# Patient Record
Sex: Male | Born: 1975 | Race: White | Hispanic: No | Smoking: Current every day smoker
Health system: Southern US, Community
[De-identification: ages and names within clinical notes are randomized; demographics above are authoritative.]

## PROBLEM LIST (undated history)

## (undated) DIAGNOSIS — R569 Unspecified convulsions: Secondary | ICD-10-CM

## (undated) DIAGNOSIS — I10 Essential (primary) hypertension: Secondary | ICD-10-CM

## (undated) DIAGNOSIS — M329 Systemic lupus erythematosus, unspecified: Secondary | ICD-10-CM

## (undated) DIAGNOSIS — IMO0002 Reserved for concepts with insufficient information to code with codable children: Secondary | ICD-10-CM

## (undated) DIAGNOSIS — F319 Bipolar disorder, unspecified: Secondary | ICD-10-CM

## (undated) DIAGNOSIS — F329 Major depressive disorder, single episode, unspecified: Secondary | ICD-10-CM

## (undated) DIAGNOSIS — M069 Rheumatoid arthritis, unspecified: Secondary | ICD-10-CM

## (undated) DIAGNOSIS — F32A Depression, unspecified: Secondary | ICD-10-CM

---

## 2000-07-12 ENCOUNTER — Emergency Department (HOSPITAL_COMMUNITY): Admission: EM | Admit: 2000-07-12 | Discharge: 2000-07-12 | Payer: Self-pay | Admitting: *Deleted

## 2001-12-15 ENCOUNTER — Emergency Department (HOSPITAL_COMMUNITY): Admission: EM | Admit: 2001-12-15 | Discharge: 2001-12-15 | Payer: Self-pay | Admitting: Emergency Medicine

## 2001-12-29 ENCOUNTER — Emergency Department (HOSPITAL_COMMUNITY): Admission: EM | Admit: 2001-12-29 | Discharge: 2001-12-29 | Payer: Self-pay | Admitting: Emergency Medicine

## 2003-04-30 ENCOUNTER — Emergency Department (HOSPITAL_COMMUNITY): Admission: EM | Admit: 2003-04-30 | Discharge: 2003-04-30 | Payer: Self-pay | Admitting: Emergency Medicine

## 2003-07-08 ENCOUNTER — Emergency Department (HOSPITAL_COMMUNITY): Admission: EM | Admit: 2003-07-08 | Discharge: 2003-07-08 | Payer: Self-pay | Admitting: Family Medicine

## 2003-11-04 ENCOUNTER — Emergency Department (HOSPITAL_COMMUNITY): Admission: EM | Admit: 2003-11-04 | Discharge: 2003-11-04 | Payer: Self-pay | Admitting: Emergency Medicine

## 2004-02-16 ENCOUNTER — Emergency Department (HOSPITAL_COMMUNITY): Admission: EM | Admit: 2004-02-16 | Discharge: 2004-02-16 | Payer: Self-pay | Admitting: Emergency Medicine

## 2004-05-14 ENCOUNTER — Emergency Department (HOSPITAL_COMMUNITY): Admission: EM | Admit: 2004-05-14 | Discharge: 2004-05-14 | Payer: Self-pay | Admitting: Emergency Medicine

## 2004-06-25 ENCOUNTER — Emergency Department (HOSPITAL_COMMUNITY): Admission: EM | Admit: 2004-06-25 | Discharge: 2004-06-25 | Payer: Self-pay | Admitting: Emergency Medicine

## 2015-09-28 ENCOUNTER — Encounter (HOSPITAL_COMMUNITY): Payer: Self-pay

## 2015-09-28 ENCOUNTER — Emergency Department (HOSPITAL_COMMUNITY)
Admission: EM | Admit: 2015-09-28 | Discharge: 2015-09-28 | Disposition: A | Discharge status: Home / Self Care | Payer: Self-pay | Attending: Emergency Medicine | Admitting: Emergency Medicine

## 2015-09-28 DIAGNOSIS — I10 Essential (primary) hypertension: Secondary | ICD-10-CM | POA: Insufficient documentation

## 2015-09-28 DIAGNOSIS — F1510 Other stimulant abuse, uncomplicated: Secondary | ICD-10-CM | POA: Insufficient documentation

## 2015-09-28 DIAGNOSIS — F17200 Nicotine dependence, unspecified, uncomplicated: Secondary | ICD-10-CM | POA: Insufficient documentation

## 2015-09-28 DIAGNOSIS — Z79899 Other long term (current) drug therapy: Secondary | ICD-10-CM | POA: Insufficient documentation

## 2015-09-28 HISTORY — DX: Essential (primary) hypertension: I10

## 2015-09-28 HISTORY — DX: Reserved for concepts with insufficient information to code with codable children: IMO0002

## 2015-09-28 HISTORY — DX: Systemic lupus erythematosus, unspecified: M32.9

## 2015-09-28 LAB — COMPREHENSIVE METABOLIC PANEL
ALT: 17 U/L (ref 17–63)
AST: 20 U/L (ref 15–41)
Albumin: 4.5 g/dL (ref 3.5–5.0)
Alkaline Phosphatase: 88 U/L (ref 38–126)
Anion gap: 7 (ref 5–15)
BUN: 16 mg/dL (ref 6–20)
CALCIUM: 9.5 mg/dL (ref 8.9–10.3)
CO2: 24 mmol/L (ref 22–32)
CREATININE: 0.93 mg/dL (ref 0.61–1.24)
Chloride: 110 mmol/L (ref 101–111)
GFR calc non Af Amer: >60 mL/min (ref >60)
Glucose, Bld: 89 mg/dL (ref 65–99)
Potassium: 4.1 mmol/L (ref 3.5–5.1)
SODIUM: 141 mmol/L (ref 135–145)
TOTAL PROTEIN: 8.7 g/dL — AB (ref 6.5–8.1)
Total Bilirubin: 0.4 mg/dL (ref 0.3–1.2)

## 2015-09-28 LAB — CBC
HCT: 44.2 % (ref 39.0–52.0)
Hemoglobin: 14.3 g/dL (ref 13.0–17.0)
MCH: 29.3 pg (ref 26.0–34.0)
MCHC: 32.4 g/dL (ref 30.0–36.0)
MCV: 90.6 fL (ref 78.0–100.0)
PLATELETS: 276 10*3/uL (ref 150–400)
RBC: 4.88 MIL/uL (ref 4.22–5.81)
RDW: 14.4 % (ref 11.5–15.5)
WBC: 9.9 10*3/uL (ref 4.0–10.5)

## 2015-09-28 LAB — ACETAMINOPHEN LEVEL: Acetaminophen (Tylenol), Serum: <10 ug/mL — ABNORMAL LOW (ref 10–30)

## 2015-09-28 LAB — SALICYLATE LEVEL

## 2015-09-28 LAB — ETHANOL

## 2015-09-28 MED ORDER — LORAZEPAM 1 MG PO TABS: 1.0000 mg | ORAL_TABLET | Freq: Three times a day (TID) | ORAL | 0 refills | Status: DC | PRN

## 2015-09-28 MED ORDER — LORAZEPAM 1 MG PO TABS
2.0000 mg | ORAL_TABLET | Freq: Once | ORAL | Status: AC
Start: 2015-09-28 — End: 2015-09-28
  Administered 2015-09-28: 2 mg via ORAL
  Filled 2015-09-28: qty 2

## 2015-09-28 NOTE — BH Assessment (Signed)
Spoke with patient about outpatient resources and provided patient with list of resources.  Provided a list of outpatient resources and he states he prefers a long term program. Discussed [facility] with patient and he states that he will go to [facility] to seek help.  Patient states that he will go to [facility] tomorrow but he plans to call around to other facilities to look at his options. He states that he will go into a program by the end of the week.   Davina Poke, LCSW Therapeutic Triage Specialist [facility] 09/28/2015 11:08 PM

## 2015-09-28 NOTE — BH Assessment (Addendum)
Spoke with patients mother regarding the IVC. She states that she knows that her son sent the messages but she does not have them available at this time. Patients mother states that the messages said "somthing like I might as well die." She states that to her knowledge her son has never attempted to hurt himself or anyone else but she wants him to get treatment for his drug use. Patients mother asked about the assessment and if patient would be admitted and was informed that the psych on call would be contacted and a decision would be made, however, that information could not be disclosed due to confidentiality - but that she is able to communicate with her son on the final disposition if she would like.   Davina Poke, LCSW Therapeutic Triage Specialist [facility] 09/28/2015 10:57 PM

## 2015-09-28 NOTE — Discharge Instructions (Signed)
Follow-up for treatment at a facility such as [facility].  Avoid use of methamphetamine.  Other available resources:  Residential Treatment Programs ASAP Residential Treatment    [facility] [address]     [address] [place], [place] [phone]      [phone] or [phone]  [facility]     The [address] (Several in [place]) [address] [place], [place] [address]    [address] [place] [address]     [place], [place] [phone]      [phone]  [facility]   [facility] [address]     [address] [place], [place] [address]                 [place], [place] [phone]                  [phone] Admissions: 8am-3pm M-F

## 2015-09-28 NOTE — ED Provider Notes (Signed)
[facility]-EMERGENCY DEPT Provider Note   CSN: [id number] Arrival date & time: 09/28/15  2018     History   Chief Complaint Chief Complaint  Patient presents with  . Suicidal    HPI Kenneth Elliott is a 40 y.o. male.  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 He states that he injects methamphetamines, daily, "to get high". He denies use of other illicit drugs. Today he was feeling like he needed to "get help" so he called his parents. He walked to their home, and just as he got there, a Sheriff's officers showed up and then brought him here. The officer states that family nurse told him that the patient was suicidal, and that they would go get papers, for commitment. As of 21:00 hours, no IVC paperwork was here. The patient denies suicidal ideation or plan at this time. He states he recently came to, [place] to get help, from [place] where he was living for 11 years. He denies other recent illnesses. There are no other known modifying factors.  HPI  Past Medical History:  Diagnosis Date  . Hypertension   . Lupus (HCC)     There are no active problems to display for this patient.   History reviewed. No pertinent surgical history.     Home Medications    Prior to Admission medications   Medication Sig Start Date End Date Taking? Authorizing Provider  loratadine (CLARITIN) 10 MG tablet Take 10 mg by mouth daily.   Yes Historical Provider, MD  LORazepam (ATIVAN) 1 MG tablet Take 1 tablet (1 mg total) by mouth 3 (three) times daily as needed for anxiety. 09/28/15   Mancel Bale, MD    Family History History reviewed. No pertinent family history.  Social History Social History  Substance Use Topics  . Smoking status: Current Every Day Smoker  . Smokeless tobacco: Current User  . Alcohol use Yes     Allergies   Review of patient's allergies indicates no known allergies.   Review of Systems Review of Systems  All other systems reviewed and  are negative.    Physical Exam Updated Vital Signs BP 140/84 (BP Location: Left Arm)   Pulse 108   Temp 98.7 F (37.1 C) (Oral)   Resp 20   SpO2 100%   Physical Exam  Constitutional: He is oriented to person, place, and time. He appears well-developed and well-nourished.  HENT:  Head: Normocephalic and atraumatic.  Right Ear: External ear normal.  Left Ear: External ear normal.  Eyes: Conjunctivae and EOM are normal. Pupils are equal, round, and reactive to light.  Neck: Normal range of motion and phonation normal. Neck supple.  Cardiovascular:  Tachycardia  Pulmonary/Chest: Effort normal. He exhibits no bony tenderness.  Musculoskeletal: Normal range of motion.  Neurological: He is alert and oriented to person, place, and time. No cranial nerve deficit or sensory deficit. He exhibits normal muscle tone. Coordination normal.  Skin: Skin is warm, dry and intact.  Psychiatric: He has a normal mood and affect. His behavior is normal. Judgment and thought content normal.  No acute psychosis. He does not appear depressed.  Nursing note and vitals reviewed.    ED Treatments / Results  Labs (all labs ordered are listed, but only abnormal results are displayed) Labs Reviewed  COMPREHENSIVE METABOLIC PANEL - Abnormal; Notable for the following:       Result Value   Total Protein 8.7 (*)    All other components within normal limits  ACETAMINOPHEN LEVEL - Abnormal; Notable for the following:    Acetaminophen (Tylenol), Serum <10 (*)    All other components within normal limits  ETHANOL  SALICYLATE LEVEL  CBC  URINE RAPID DRUG SCREEN, HOSP PERFORMED    EKG  EKG Interpretation None       Radiology No results found.  Procedures Procedures (including critical care time)  Medications Ordered in ED Medications  LORazepam (ATIVAN) tablet 2 mg (2 mg Oral Given 09/28/15 2148)     Initial Impression / Assessment and Plan / ED Course  I have reviewed the triage vital  signs and the nursing notes.  Pertinent labs & imaging results that were available during my care of the patient were reviewed by me and considered in my medical decision making (see chart for details).  Clinical Course    Medications  LORazepam (ATIVAN) tablet 2 mg (2 mg Oral Given 09/28/15 2148)    Patient Vitals for the past 24 hrs:  BP Temp Temp src Pulse Resp SpO2  09/28/15 2040 140/84 98.7 F (37.1 C) Oral 108 20 100 %   TTS consult- they gave him referrals for outpatient treatment for substance abuse.  22:00 PM Reevaluation with update and discussion. After initial assessment and treatment, an updated evaluation reveals he is alert and comfortable. He continues to deny suicidal ideation. Patient's mother petitioned for commitment secondary to concern for suicidality. Patient continues to deny suicidality. He is alert and appropriate. IVC petition was rescinded, by me. Shanessa Hodak L    Final Clinical Impressions(s) / ED Diagnoses   Final diagnoses:  Methamphetamine abuse    Substance abuse, not requiring detoxification. No suicidal or homicidal ideation. He is stable for discharge with outpatient management. We'll give a short term prescription of Ativan to help with avoidance of craving for methamphetamine.  Nursing Notes Reviewed/ Care Coordinated Applicable Imaging Reviewed Interpretation of Laboratory Data incorporated into ED treatment  The patient appears reasonably screened and/or stabilized for discharge and I doubt any other medical condition or other [facility] requiring further screening, evaluation, or treatment in the ED at this time prior to discharge.  Plan: Home Medications- continue; Home Treatments- avoid meth; return here if the recommended treatment, does not improve the symptoms; Recommended follow up- PCP prn, Treatment for substance abuse, as an outpatient   New Prescriptions New Prescriptions   LORAZEPAM (ATIVAN) 1 MG TABLET    Take 1 tablet (1 mg  total) by mouth 3 (three) times daily as needed for anxiety.     Mancel Bale, MD 09/28/15 [phone]

## 2015-09-28 NOTE — ED Notes (Signed)
[employer] here with IVC papers, TTS and Dr Effie Shy aware

## 2015-09-28 NOTE — ED Triage Notes (Signed)
Pt states that he's not suicidal or homicidal, he states that he's very addicted to meth, that's his drug of choice. Pt hasn't done any drugs since Monday, he states that he wants to get off the drugs and get clean and get his life together.

## 2015-09-28 NOTE — BH Assessment (Addendum)
Assessment Note  Kenneth Elliott is an 40 y.o. male presenting under IVC by his mother Kenneth Elliott [phone]..   IVC states   Danger to self and others. Respondent is on [employer] and has been sending messages via text and phone calls that suicide is his only option. Respondent is also hallucinating saying he hears people talking to him. He goes through manic episodes calling people over and over. Respondent reportedly uses crystal meth everyday.   Patient states that he did send the text messages earlier because he was upset with his mother. Patient states that he moved from [place] on Monday with the hopes of getting a new ID and SS card and go [facility]. Patient states that his mother told him that he had to stop smoking and that he would not get a nicotine patch and he became upset and he felt that he was tricked into coming to [place]. Patient states that he sent the text messages out of anger but state that he did not mean to say it and he apologized. Patient states that he has difficulty with "outbursts" and states that he would like to learn better coping skills. Patient states "everybody says stuff they don't mean sometimes." Patient states that he feels that his mother wants him to go to treatment "so it won't be her problem and they can fix me." Patient states that he has tried to explain that he is willing to go to treatment but would like to go to a facility that allows smoking or nicotine patches.    Patient denies SI and history of attempts. Patient denies self injurious behaviors. Patient denies HI and history of aggression. Patient denies AVH and does not appear to be responding to internal stimuli during the assessment. Patient states that he uses about .5 grams of Methamphetamines about two to three times per week. Patient states that he last used Meth on Monday on his way to [place] from [place]. Patient denies use of other drugs  or alcohol. Patient UDS not collected and BAL <5 at time of assessment.  Patient is calm and cooperative and appears to have insight. Patient denies SI/HI and AVH and contracts for safety at this time.   Consulted with Donell Sievert, PA-C who recommends patient be discharged to follow up with outpatient resources.   Diagnosis: Amphetamine-type substance use disorder  Past Medical History:  Past Medical History:  Diagnosis Date  . Hypertension   . Lupus (HCC)     History reviewed. No pertinent surgical history.  Family History: History reviewed. No pertinent family history.  Social History:  reports that he has been smoking.  He uses smokeless tobacco. He reports that he drinks alcohol. His drug history is not on file.  Additional Social History:  Alcohol / Drug Use Pain Medications: Denies Prescriptions: Denies Over the Counter: Denies History of alcohol / drug use?: Yes Longest period of sobriety (when/how long): 2 months Substance #1 Name of Substance 1: Methamphetamines 1 - Age of First Use: 38 1 - Amount (size/oz): .5 gram 1 - Frequency: "couple times a week" 1 - Duration: ongoing 1 - Last Use / Amount: Monday morning  CIWA: CIWA-Ar BP: 140/84 Pulse Rate: 108 COWS:    Allergies: No Known Allergies  Home Medications:  (Not in a hospital admission)  OB/GYN Status:  No LMP for male patient.  General Assessment Data [facility] of [facility]: [facility] TTS Assessment: In system Is this a Tele  or Face-to-Face Assessment?: Face-to-Face Is this an Initial Assessment or a Re-assessment for this encounter?: Initial Assessment Marital status: Divorced Is patient pregnant?: No Pregnancy Status: No Living Arrangements:  (just moved here Monday) Admission Status: Involuntary Is patient capable of signing voluntary admission?: No Referral Source: Other (IVC)     Crisis Care Plan Living Arrangements:  (just moved here Monday) Name of Psychiatrist: None Name of Therapist:  None  Education Status Is patient currently in school?: No Highest grade of school patient has completed: 12th  Risk to self with the past 6 months Suicidal Ideation: No Has patient been a risk to self within the past 6 months prior to admission? : No Suicidal Intent: No Has patient had any suicidal intent within the past 6 months prior to admission? : No Is patient at risk for suicide?: No Suicidal Plan?: No Has patient had any suicidal plan within the past 6 months prior to admission? : No Access to Means: No What has been your use of drugs/alcohol within the last 12 months?: Meth 3x/week Previous Attempts/Gestures: No How many times?: 0 Other Self Harm Risks: Denies Triggers for Past Attempts: None known Intentional Self Injurious Behavior: None Family Suicide History: No Recent stressful life event(s):  (conflict with mother) Persecutory voices/beliefs?: No Depression: No Depression Symptoms:  (denies symptoms) Substance abuse history and/or treatment for substance abuse?: Yes Suicide prevention information given to non-admitted patients: Not applicable  Risk to Others within the past 6 months Homicidal Ideation: No Does patient have any lifetime risk of violence toward others beyond the six months prior to admission? : No Thoughts of Harm to Others: No Current Homicidal Intent: No Current Homicidal Plan: No Access to Homicidal Means: No Identified Victim: Denies History of harm to others?: No Assessment of Violence: None Noted Violent Behavior Description: Denies Does patient have access to weapons?: No Criminal Charges Pending?: No Does patient have a court date: No Is patient on probation?: No  Psychosis Hallucinations: None noted Delusions: None noted  Mental Status Report Appearance/Hygiene: Unremarkable Eye Contact: Good Motor Activity: Unremarkable Speech: Logical/coherent Level of Consciousness: Alert Mood: Pleasant Affect: Appropriate to  circumstance Anxiety Level: None Thought Processes: Coherent, Relevant Judgement: Partial Orientation: Person, Place, Time, Situation, Appropriate for developmental age Obsessive Compulsive Thoughts/Behaviors: None  Cognitive Functioning Concentration: Normal Memory: Recent Intact, Remote Intact IQ: Average Insight: Good Impulse Control: Good Appetite: Good Sleep: No Change Total Hours of Sleep:  (7-8) Vegetative Symptoms: None  ADLScreening [facility] Assessment Services) Patient's cognitive ability adequate to safely complete daily activities?: Yes Patient able to express need for assistance with ADLs?: Yes Independently performs ADLs?: Yes (appropriate for developmental age)  Prior Inpatient Therapy Prior Inpatient Therapy: No Prior Therapy Dates: N/A Prior Therapy Facilty/Provider(s): N/A Reason for Treatment: N/A  Prior Outpatient Therapy Prior Outpatient Therapy: No Prior Therapy Dates: N/A Prior Therapy Facilty/Provider(s): N/A Reason for Treatment: N/A Does patient have an ACCT team?: No Does patient have Intensive In-House Services?  : No Does patient have [facility] services? : No Does patient have P4CC services?: No  ADL Screening (condition at time of admission) Patient's cognitive ability adequate to safely complete daily activities?: Yes Is the patient deaf or have difficulty hearing?: No Does the patient have difficulty seeing, even when wearing glasses/contacts?: No Does the patient have difficulty concentrating, remembering, or making decisions?: No Patient able to express need for assistance with ADLs?: Yes Does the patient have difficulty dressing or bathing?: No Independently performs ADLs?: Yes (appropriate for developmental age) Does  the patient have difficulty walking or climbing stairs?: No Weakness of Legs: None Weakness of Arms/Hands: None  Home Assistive Devices/Equipment Home Assistive Devices/Equipment: None    Abuse/Neglect Assessment  (Assessment to be complete while patient is alone) Physical Abuse: Denies Verbal Abuse: Denies Sexual Abuse: Denies Exploitation of patient/patient's resources: Denies Self-Neglect: Denies Values / Beliefs Cultural Requests During Hospitalization: None Spiritual Requests During Hospitalization: None   Advance Directives (For Healthcare) Does patient have an advance directive?: No Would patient like information on creating an advanced directive?: No - patient declined information    Additional Information 1:1 In Past 12 Months?: No CIRT Risk: No Elopement Risk: No Does patient have medical clearance?: No     Disposition:  Disposition Initial Assessment Completed for this Encounter: Yes Disposition of Patient: Outpatient treatment, Referred to (per Donell Sievert, PA-C) Type of outpatient treatment: Adult, [facility] - Intensive Outpatient Patient referred to: [facility], [facility], [facility]  On Site Evaluation by:   Reviewed with Physician:    Lucciana Head 09/28/2015 11:30 PM

## 2015-09-28 NOTE — BH Assessment (Addendum)
Assessment completed. Consulted with Donell Sievert, PA-C who states that patient does not meet inpatient criteria and can be discharged with outpatient resources. Dr. Effie Shy, EDP, is in agreement and states that he will rescind the IVC.    Davina Poke, LCSW Therapeutic Triage Specialist [facility] 09/28/2015 10:54 PM

## 2015-09-28 NOTE — ED Notes (Signed)
Patient was alert, oriented and stable upon discharge. RN went over AVS and patient had no further questions.  

## 2015-10-04 ENCOUNTER — Emergency Department (HOSPITAL_COMMUNITY)
Admission: EM | Admit: 2015-10-04 | Discharge: 2015-10-05 | Disposition: A | Discharge status: Other Acute Inpatient Hospital | Payer: Self-pay

## 2015-10-04 ENCOUNTER — Encounter (HOSPITAL_COMMUNITY): Payer: Self-pay | Admitting: *Deleted

## 2015-10-04 DIAGNOSIS — I10 Essential (primary) hypertension: Secondary | ICD-10-CM | POA: Insufficient documentation

## 2015-10-04 DIAGNOSIS — R45851 Suicidal ideations: Secondary | ICD-10-CM | POA: Insufficient documentation

## 2015-10-04 DIAGNOSIS — F1910 Other psychoactive substance abuse, uncomplicated: Secondary | ICD-10-CM | POA: Insufficient documentation

## 2015-10-04 DIAGNOSIS — Z79899 Other long term (current) drug therapy: Secondary | ICD-10-CM | POA: Insufficient documentation

## 2015-10-04 DIAGNOSIS — F17200 Nicotine dependence, unspecified, uncomplicated: Secondary | ICD-10-CM | POA: Insufficient documentation

## 2015-10-04 LAB — COMPREHENSIVE METABOLIC PANEL
ALBUMIN: 3.8 g/dL (ref 3.5–5.0)
ALK PHOS: 76 U/L (ref 38–126)
ALT: 15 U/L — AB (ref 17–63)
ANION GAP: 8 (ref 5–15)
AST: 19 U/L (ref 15–41)
BILIRUBIN TOTAL: 0.3 mg/dL (ref 0.3–1.2)
BUN: <5 mg/dL — ABNORMAL LOW (ref 6–20)
CALCIUM: 9.2 mg/dL (ref 8.9–10.3)
CO2: 29 mmol/L (ref 22–32)
CREATININE: 0.87 mg/dL (ref 0.61–1.24)
Chloride: 103 mmol/L (ref 101–111)
GFR calc Af Amer: >60 mL/min (ref >60)
GFR calc non Af Amer: >60 mL/min (ref >60)
GLUCOSE: 76 mg/dL (ref 65–99)
Potassium: 3.2 mmol/L — ABNORMAL LOW (ref 3.5–5.1)
Sodium: 140 mmol/L (ref 135–145)
TOTAL PROTEIN: 7.2 g/dL (ref 6.5–8.1)

## 2015-10-04 LAB — CBC
HEMATOCRIT: 42.7 % (ref 39.0–52.0)
Hemoglobin: 13.7 g/dL (ref 13.0–17.0)
MCH: 29 pg (ref 26.0–34.0)
MCHC: 32.1 g/dL (ref 30.0–36.0)
MCV: 90.5 fL (ref 78.0–100.0)
Platelets: 286 10*3/uL (ref 150–400)
RBC: 4.72 MIL/uL (ref 4.22–5.81)
RDW: 13.8 % (ref 11.5–15.5)
WBC: 11.8 10*3/uL — ABNORMAL HIGH (ref 4.0–10.5)

## 2015-10-04 LAB — RAPID URINE DRUG SCREEN, HOSP PERFORMED
Amphetamines: NOT DETECTED
BARBITURATES: NOT DETECTED
Benzodiazepines: NOT DETECTED
COCAINE: NOT DETECTED
Opiates: NOT DETECTED
TETRAHYDROCANNABINOL: NOT DETECTED

## 2015-10-04 LAB — ACETAMINOPHEN LEVEL

## 2015-10-04 LAB — SALICYLATE LEVEL: Salicylate Lvl: <4 mg/dL (ref 2.8–30.0)

## 2015-10-04 LAB — ETHANOL: Alcohol, Ethyl (B): <5 mg/dL (ref <5)

## 2015-10-04 MED ORDER — LORAZEPAM 1 MG PO TABS
1.0000 mg | ORAL_TABLET | Freq: Once | ORAL | Status: AC
  Administered 2015-10-04: 1 mg via ORAL
  Filled 2015-10-04: qty 1

## 2015-10-04 MED ORDER — NICOTINE 21 MG/24HR TD PT24
21.0000 mg | MEDICATED_PATCH | Freq: Once | TRANSDERMAL | Status: DC
  Administered 2015-10-04: 21 mg via TRANSDERMAL
  Filled 2015-10-04: qty 1

## 2015-10-04 NOTE — ED Triage Notes (Addendum)
Pt wants detox from heroin, meth, cocaine, benzodiazapine's. Last use Saturday shooting heroin. Pt denies ETOH use, denies HI, states he denies SI but does have SI thoughts sometime. Pt states he was seen at [facility] but was not ready to commit to detox.

## 2015-10-04 NOTE — ED Notes (Signed)
Pt provided with pod C information. Pt up to phone. On call with mother.

## 2015-10-04 NOTE — ED Provider Notes (Signed)
[facility]-EMERGENCY DEPT Provider Note   CSN: [id number] Arrival date & time: 10/04/15  4098     History   Chief Complaint Chief Complaint  Patient presents with  . Drug Problem  . Suicidal    HPI Kenneth Elliott is a 40 y.o. male.  Patient is a 40 year old male who requests detox. He states he has a long-standing history of methamphetamine use. He also recently has been using heroin and cocaine at times. He currently denies any other drug use. He denies any alcohol use. He is starting to feel anxious and panicky. He hasn't used in 2 days. He also is feeling very depressed. He states he's having thoughts of wanting to kill himself. He states at times he feels like he might overdose on drugs. Other than feeling anxious, he denies any physical complaints. He does have a history of hypertension and previously was taking clonidine and metoprolol but hasn't had these medications for the last couple of weeks.    Drug Problem  Pertinent negatives include no chest pain, no abdominal pain, no headaches and no shortness of breath.    Past Medical History:  Diagnosis Date  . Hypertension   . Lupus (HCC)     There are no active problems to display for this patient.   History reviewed. No pertinent surgical history.     Home Medications    Prior to Admission medications   Medication Sig Start Date End Date Taking? Authorizing Provider  LORazepam (ATIVAN) 1 MG tablet Take 1 tablet (1 mg total) by mouth 3 (three) times daily as needed for anxiety. 09/28/15  Yes Mancel Bale, MD    Family History History reviewed. No pertinent family history.  Social History Social History  Substance Use Topics  . Smoking status: Current Every Day Smoker  . Smokeless tobacco: Current User  . Alcohol use Yes     Allergies   Review of patient's allergies indicates no known allergies.   Review of Systems Review of Systems  Constitutional: Negative for chills, diaphoresis, fatigue and fever.   HENT: Negative for congestion, rhinorrhea and sneezing.   Eyes: Negative.   Respiratory: Negative for cough, chest tightness and shortness of breath.   Cardiovascular: Negative for chest pain and leg swelling.  Gastrointestinal: Negative for abdominal pain, blood in stool, diarrhea, nausea and vomiting.  Genitourinary: Negative for difficulty urinating, flank pain, frequency and hematuria.  Musculoskeletal: Negative for arthralgias and back pain.  Skin: Negative for rash.  Neurological: Negative for dizziness, speech difficulty, weakness, numbness and headaches.  Psychiatric/Behavioral: Positive for agitation, sleep disturbance and suicidal ideas. The patient is nervous/anxious.      Physical Exam Updated Vital Signs BP 154/92   Pulse 101   Temp 98.7 F (37.1 C) (Oral)   Resp 20   Ht 5\' 9"  (1.753 m)   Wt 160 lb 4.8 oz (72.7 kg)   SpO2 100%   BMI 23.67 kg/m   Physical Exam  Constitutional: He is oriented to person, place, and time. He appears well-developed and well-nourished.  HENT:  Head: Normocephalic and atraumatic.  Eyes: Pupils are equal, round, and reactive to light.  Neck: Normal range of motion. Neck supple.  Cardiovascular: Normal rate, regular rhythm and normal heart sounds.   Pulmonary/Chest: Effort normal and breath sounds normal. No respiratory distress. He has no wheezes. He has no rales. He exhibits no tenderness.  Abdominal: Soft. Bowel sounds are normal. There is no tenderness. There is no rebound and no guarding.  Musculoskeletal: Normal  range of motion. He exhibits no edema.  Lymphadenopathy:    He has no cervical adenopathy.  Neurological: He is alert and oriented to person, place, and time.  Skin: Skin is warm and dry. No rash noted.  Psychiatric: He has a normal mood and affect.     ED Treatments / Results  Labs (all labs ordered are listed, but only abnormal results are displayed) Labs Reviewed  COMPREHENSIVE METABOLIC PANEL - Abnormal; Notable  for the following:       Result Value   Potassium 3.2 (*)    BUN <5 (*)    ALT 15 (*)    All other components within normal limits  ACETAMINOPHEN LEVEL - Abnormal; Notable for the following:    Acetaminophen (Tylenol), Serum <10 (*)    All other components within normal limits  CBC - Abnormal; Notable for the following:    WBC 11.8 (*)    All other components within normal limits  ETHANOL  SALICYLATE LEVEL  URINE RAPID DRUG SCREEN, HOSP PERFORMED    EKG  EKG Interpretation None       Radiology No results found.  Procedures Procedures (including critical care time)  Medications Ordered in ED Medications  nicotine (NICODERM CQ - dosed in mg/24 hours) patch 21 mg (21 mg Transdermal Patch Applied 10/04/15 2009)  LORazepam (ATIVAN) tablet 1 mg (1 mg Oral Given 10/04/15 2139)     Initial Impression / Assessment and Plan / ED Course  I have reviewed the triage vital signs and the nursing notes.  Pertinent labs & imaging results that were available during my care of the patient were reviewed by me and considered in my medical decision making (see chart for details).  Clinical Course    Patient has been medically cleared and is currently awaiting TTS consult.  Final Clinical Impressions(s) / ED Diagnoses   Final diagnoses:  Polysubstance abuse  Suicidal thoughts    New Prescriptions New Prescriptions   No medications on file     Rolan Bucco, MD 10/05/15 [phone]

## 2015-10-04 NOTE — ED Notes (Signed)
EDP at bedside  

## 2015-10-04 NOTE — ED Notes (Signed)
Pt complains of feeling anxious. MD aware.

## 2015-10-05 ENCOUNTER — Inpatient Hospital Stay (HOSPITAL_COMMUNITY)
Admission: EM | Admit: 2015-10-05 | Discharge: 2015-10-10 | DRG: 885 | Disposition: A | Discharge status: Home / Self Care | Payer: Federal, State, Local not specified - Other | Source: Intra-hospital | Attending: Psychiatry | Admitting: Psychiatry

## 2015-10-05 ENCOUNTER — Encounter (HOSPITAL_COMMUNITY): Payer: Self-pay

## 2015-10-05 DIAGNOSIS — F1123 Opioid dependence with withdrawal: Secondary | ICD-10-CM | POA: Diagnosis present

## 2015-10-05 DIAGNOSIS — F1523 Other stimulant dependence with withdrawal: Secondary | ICD-10-CM | POA: Diagnosis present

## 2015-10-05 DIAGNOSIS — R45851 Suicidal ideations: Secondary | ICD-10-CM | POA: Diagnosis not present

## 2015-10-05 DIAGNOSIS — F332 Major depressive disorder, recurrent severe without psychotic features: Secondary | ICD-10-CM | POA: Diagnosis present

## 2015-10-05 DIAGNOSIS — M069 Rheumatoid arthritis, unspecified: Secondary | ICD-10-CM | POA: Diagnosis present

## 2015-10-05 DIAGNOSIS — M329 Systemic lupus erythematosus, unspecified: Secondary | ICD-10-CM | POA: Diagnosis present

## 2015-10-05 DIAGNOSIS — F17210 Nicotine dependence, cigarettes, uncomplicated: Secondary | ICD-10-CM | POA: Diagnosis present

## 2015-10-05 DIAGNOSIS — F1920 Other psychoactive substance dependence, uncomplicated: Secondary | ICD-10-CM | POA: Diagnosis not present

## 2015-10-05 DIAGNOSIS — Z818 Family history of other mental and behavioral disorders: Secondary | ICD-10-CM

## 2015-10-05 DIAGNOSIS — Z79899 Other long term (current) drug therapy: Secondary | ICD-10-CM

## 2015-10-05 DIAGNOSIS — F419 Anxiety disorder, unspecified: Secondary | ICD-10-CM | POA: Diagnosis present

## 2015-10-05 DIAGNOSIS — F329 Major depressive disorder, single episode, unspecified: Secondary | ICD-10-CM | POA: Diagnosis present

## 2015-10-05 DIAGNOSIS — I10 Essential (primary) hypertension: Secondary | ICD-10-CM | POA: Diagnosis present

## 2015-10-05 DIAGNOSIS — G4700 Insomnia, unspecified: Secondary | ICD-10-CM | POA: Diagnosis present

## 2015-10-05 DIAGNOSIS — K219 Gastro-esophageal reflux disease without esophagitis: Secondary | ICD-10-CM | POA: Diagnosis present

## 2015-10-05 HISTORY — DX: Unspecified convulsions: R56.9

## 2015-10-05 HISTORY — DX: Rheumatoid arthritis, unspecified: M06.9

## 2015-10-05 MED ORDER — METHOCARBAMOL 500 MG PO TABS: 500.0000 mg | ORAL_TABLET | Freq: Three times a day (TID) | ORAL | Status: AC | PRN

## 2015-10-05 MED ORDER — CLONIDINE HCL 0.1 MG PO TABS
0.1000 mg | ORAL_TABLET | Freq: Four times a day (QID) | ORAL | Status: AC
  Administered 2015-10-05 – 2015-10-06 (×7): 0.1 mg via ORAL
  Filled 2015-10-05 (×8): qty 1

## 2015-10-05 MED ORDER — MAGNESIUM HYDROXIDE 400 MG/5ML PO SUSP: 30.0000 mL | Freq: Every day | ORAL | Status: DC | PRN

## 2015-10-05 MED ORDER — CLONIDINE HCL 0.1 MG PO TABS
0.1000 mg | ORAL_TABLET | Freq: Two times a day (BID) | ORAL | Status: DC | PRN
  Administered 2015-10-05: 0.1 mg via ORAL
  Filled 2015-10-05: qty 1

## 2015-10-05 MED ORDER — CLONIDINE HCL 0.1 MG PO TABS
0.1000 mg | ORAL_TABLET | Freq: Every day | ORAL | Status: AC
  Administered 2015-10-09 – 2015-10-10 (×2): 0.1 mg via ORAL
  Filled 2015-10-05 (×2): qty 1

## 2015-10-05 MED ORDER — NICOTINE 21 MG/24HR TD PT24
21.0000 mg | MEDICATED_PATCH | Freq: Every day | TRANSDERMAL | Status: DC
  Administered 2015-10-05 – 2015-10-10 (×6): 21 mg via TRANSDERMAL
  Filled 2015-10-05 (×8): qty 1

## 2015-10-05 MED ORDER — HYDROXYZINE HCL 25 MG PO TABS
25.0000 mg | ORAL_TABLET | Freq: Four times a day (QID) | ORAL | Status: AC | PRN
  Administered 2015-10-05 – 2015-10-09 (×8): 25 mg via ORAL
  Filled 2015-10-05 (×4): qty 1
  Filled 2015-10-05: qty 10
  Filled 2015-10-05 (×3): qty 1

## 2015-10-05 MED ORDER — LOPERAMIDE HCL 2 MG PO CAPS: 2.0000 mg | ORAL_CAPSULE | ORAL | Status: AC | PRN

## 2015-10-05 MED ORDER — SERTRALINE HCL 25 MG PO TABS
25.0000 mg | ORAL_TABLET | Freq: Every day | ORAL | Status: DC
  Administered 2015-10-05 – 2015-10-06 (×2): 25 mg via ORAL
  Filled 2015-10-05 (×4): qty 1

## 2015-10-05 MED ORDER — CLONAZEPAM 0.5 MG PO TABS
0.2500 mg | ORAL_TABLET | Freq: Two times a day (BID) | ORAL | Status: DC
  Administered 2015-10-05 – 2015-10-09 (×8): 0.25 mg via ORAL
  Filled 2015-10-05 (×8): qty 1

## 2015-10-05 MED ORDER — NAPROXEN 500 MG PO TABS
500.0000 mg | ORAL_TABLET | Freq: Two times a day (BID) | ORAL | Status: AC | PRN
  Administered 2015-10-05 – 2015-10-08 (×2): 500 mg via ORAL
  Filled 2015-10-05 (×2): qty 1

## 2015-10-05 MED ORDER — DICYCLOMINE HCL 20 MG PO TABS
20.0000 mg | ORAL_TABLET | Freq: Four times a day (QID) | ORAL | Status: AC | PRN
  Administered 2015-10-09: 20 mg via ORAL
  Filled 2015-10-05: qty 1

## 2015-10-05 MED ORDER — LORAZEPAM 1 MG PO TABS
1.0000 mg | ORAL_TABLET | Freq: Four times a day (QID) | ORAL | Status: DC | PRN
  Administered 2015-10-05 (×2): 1 mg via ORAL
  Filled 2015-10-05 (×2): qty 1

## 2015-10-05 MED ORDER — ALUM & MAG HYDROXIDE-SIMETH 200-200-20 MG/5ML PO SUSP
30.0000 mL | ORAL | Status: DC | PRN
  Administered 2015-10-05 – 2015-10-07 (×4): 30 mL via ORAL
  Filled 2015-10-05 (×4): qty 30

## 2015-10-05 MED ORDER — ONDANSETRON 4 MG PO TBDP
4.0000 mg | ORAL_TABLET | Freq: Four times a day (QID) | ORAL | Status: AC | PRN
  Filled 2015-10-05: qty 1

## 2015-10-05 MED ORDER — GABAPENTIN 300 MG PO CAPS
300.0000 mg | ORAL_CAPSULE | Freq: Three times a day (TID) | ORAL | Status: DC
  Administered 2015-10-05 – 2015-10-06 (×3): 300 mg via ORAL
  Filled 2015-10-05 (×7): qty 1

## 2015-10-05 MED ORDER — ENSURE ENLIVE PO LIQD
237.0000 mL | Freq: Two times a day (BID) | ORAL | Status: DC
  Administered 2015-10-05 – 2015-10-10 (×10): 237 mL via ORAL

## 2015-10-05 MED ORDER — DICYCLOMINE HCL 10 MG PO CAPS
10.0000 mg | ORAL_CAPSULE | Freq: Three times a day (TID) | ORAL | Status: DC
  Administered 2015-10-05 (×2): 10 mg via ORAL
  Filled 2015-10-05 (×2): qty 1

## 2015-10-05 MED ORDER — CLONIDINE HCL 0.1 MG PO TABS
0.1000 mg | ORAL_TABLET | ORAL | Status: AC
Start: 2015-10-07 — End: 2015-10-08
  Administered 2015-10-07 – 2015-10-08 (×4): 0.1 mg via ORAL
  Filled 2015-10-05 (×6): qty 1

## 2015-10-05 MED ORDER — TRAZODONE HCL 50 MG PO TABS
50.0000 mg | ORAL_TABLET | Freq: Every day | ORAL | Status: DC
  Administered 2015-10-05 – 2015-10-09 (×5): 50 mg via ORAL
  Filled 2015-10-05 (×8): qty 1

## 2015-10-05 MED ORDER — ACETAMINOPHEN 325 MG PO TABS: 650.0000 mg | ORAL_TABLET | Freq: Four times a day (QID) | ORAL | Status: DC | PRN

## 2015-10-05 MED ORDER — POTASSIUM CHLORIDE CRYS ER 20 MEQ PO TBCR
20.0000 meq | EXTENDED_RELEASE_TABLET | Freq: Every day | ORAL | Status: AC
  Administered 2015-10-05 – 2015-10-06 (×2): 20 meq via ORAL
  Filled 2015-10-05 (×4): qty 1

## 2015-10-05 NOTE — ED Notes (Signed)
TTS in process 

## 2015-10-05 NOTE — H&P (Signed)
Psychiatric Admission Assessment Adult  Patient Identification: Kenneth Elliott MRN:  [id number] Date of Evaluation:  10/05/2015 Chief Complaint:  MDD Principal Diagnosis: Polysubstance dependence ([place]) Diagnosis:   Patient Active Problem List   Diagnosis Date Noted  . MDD (major depressive disorder) ([place]) [F32.9] 10/05/2015  . Polysubstance dependence [facility]) [F19.20] 10/05/2015   History of Present Illness:  Mr. Kenneth Elliott is a 40 year old with self reported history of seizure, RA, SLE who presented for detox.   He states that he came from [place] to [place] a few days ago. He feels "tired of (emotionally) hurting my wife" and came to [facility] for detox treatment. He chose [facility] as he has family support. He states he has never tried any treatment before. 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 He is hoping to get sober and gets back together with her. He states he has been using Suboxone (uses one fourth pill) mixed with methamphetamine. He uses cocaine last Saturday after a long time but denies regular use. He used heroin lat Saturday for the first time. He states that he uses it to "escape reality." He admits using $250,000 for buying drugs.   He feels anxious, depressed and endorses insomnia. He lost 25 lbs/1.5 month. He states passive SI but denies any plans/intent. He has been using Ativan 1 tab (dose unknown) BID for a couple of years, which has been prescribed by PCP. He denies any alcohol use for the past 10 years.   Per [place] controlled substance database, he was prescribed lorazepam 1 mg 20 tab on 8/23.  Associated Signs/Symptoms: Depression Symptoms:  depressed mood, suicidal thoughts without plan, (Hypo) Manic Symptoms:  denies Anxiety Symptoms:  mild anxiety Psychotic Symptoms:  denies PTSD Symptoms: Negative Total Time spent with patient: 45 minutes  Past Psychiatric History: denies  Is the patient at risk to self? Yes.    Has the patient  been a risk to self in the past 6 months? No.  Has the patient been a risk to self within the distant past? No.  Is the patient a risk to others? No.  Has the patient been a risk to others in the past 6 months? No.  Has the patient been a risk to others within the distant past? No.   Prior Inpatient Therapy:  denies Prior Outpatient Therapy:  denies  Alcohol Screening: 1. How often do you have a drink containing alcohol?: Never 2. How many drinks containing alcohol do you have on a typical day when you are drinking?: 1 or 2 3. How often do you have six or more drinks on one occasion?: Never Preliminary Score: 0 9. Have you or someone else been injured as a result of your drinking?: No 10. Has a relative or friend or a doctor or another health worker been concerned about your drinking or suggested you cut down?: No Alcohol Use Disorder Identification Test Final Score (AUDIT): 0 Brief Intervention: Patient declined brief intervention Substance Abuse History in the last 12 months:  Yes.   Consequences of Substance Abuse: Withdrawal Symptoms:   Diaphoresis Diarrhea Nausea Previous Psychotropic Medications: No  Psychological Evaluations: No  Past Medical History:  Past Medical History:  Diagnosis Date  . Hypertension   . Lupus ([place])   . Rheumatoid arthritis ([place])   . Seizures ([place])    per patient   History reviewed. No pertinent surgical history. Family History: History reviewed. No pertinent family history. Family  Psychiatric  History:  Mother, sister- anxiety Biological Father- paranoid schizophrenia Denies family history of suicide Tobacco Screening: Have you used any form of tobacco in the last 30 days? (Cigarettes, Smokeless Tobacco, Cigars, and/or Pipes): Yes Tobacco use, Select all that apply: 5 or more cigarettes per day Are you interested in Tobacco Cessation Medications?: Yes, will notify MD for an order Counseled patient on smoking cessation including recognizing danger  situations, developing coping skills and basic information about quitting provided: Yes Social History:  History  Alcohol Use  . Yes     History  Drug use: Unknown    Additional Social History: Marital status: Divorced Divorced, when?: last year What types of issues is patient dealing with in the relationship?: "I divorced her so she would not have to see me like this", wife has said she will not have relationship w him until he is clean from drugs Are you sexually active?: Yes What is your sexual orientation?: heterosexual Has your sexual activity been affected by drugs, alcohol, medication, or emotional stress?: unknown Does patient have children?: Yes How many children?: 1 How is patient's relationship with their children?: 60 yo daughter lives in [place], "shes a mess"   Divorced in [address]12 Girl 40 year old in [place]  Pain Medications: Reports abuse of Rx opiates occasionally Prescriptions: Reports ativan daily Over the Counter: none History of alcohol / drug use?: Yes Longest period of sobriety (when/how long): 10 years currently Negative Consequences of Use: Financial Withdrawal Symptoms: Agitation, Cramps, Tachycardia Name of Substance 1: Methamphetamine 1 - Age of First Use: 37 1 - Amount (size/oz): unknown-varies 1 - Frequency: daily 1 - Duration: years 1 - Last Use / Amount: last Saturday-2 days ago Name of Substance 2: Heroine 2 - Age of First Use: 40 2 - Amount (size/oz): unknown 2 - Frequency: tried 1st time last Saturday 2 - Last Use / Amount: 2 days ago Name of Substance 3: Cocaine 3 - Age of First Use: 62s 3 - Amount (size/oz): unknown 3 - Frequency: 3-4 times a week 3 - Duration: years 3 - Last Use / Amount: 2 days ago Name of Substance 4: Nicotine/Cigarettes 4 - Age of First Use: 14 4 - Amount (size/oz): 1 pack per day 4 - Duration: ongoing 4 - Last Use / Amount: 10/04/15   Name of Substance 6: Suboxone 6 - Age of First Use: 3 6 - Amount  (size/oz): unknown 6 - Frequency: daily 6 - Duration: 3  6 - Last Use / Amount: stopped 4-5 yrs ago        Allergies:  No Known Allergies Lab Results:  Results for orders placed or performed during the hospital encounter of 10/04/15 (from the past 48 hour(s))  Ethanol     Status: None   Collection Time: 10/04/15  7:43 PM  Result Value Ref Range   Alcohol, Ethyl (B) <5 <5 mg/dL    Comment:        LOWEST DETECTABLE LIMIT FOR SERUM ALCOHOL IS 5 mg/dL FOR MEDICAL PURPOSES ONLY   Salicylate level     Status: None   Collection Time: 10/04/15  7:43 PM  Result Value Ref Range   Salicylate Lvl <9.4 2.8 - 30.0 mg/dL  Acetaminophen level     Status: Abnormal   Collection Time: 10/04/15  7:43 PM  Result Value Ref Range   Acetaminophen (Tylenol), Serum <10 (L) 10 - 30 ug/mL    Comment:        THERAPEUTIC CONCENTRATIONS VARY  SIGNIFICANTLY. A RANGE OF 10-30 ug/mL MAY BE AN EFFECTIVE CONCENTRATION FOR MANY PATIENTS. HOWEVER, SOME ARE BEST TREATED AT CONCENTRATIONS OUTSIDE THIS RANGE. ACETAMINOPHEN CONCENTRATIONS >150 ug/mL AT 4 HOURS AFTER INGESTION AND >50 ug/mL AT 12 HOURS AFTER INGESTION ARE OFTEN ASSOCIATED WITH TOXIC REACTIONS.   Rapid urine drug screen (hospital performed)     Status: None   Collection Time: 10/04/15  7:43 PM  Result Value Ref Range   Opiates NONE DETECTED NONE DETECTED   Cocaine NONE DETECTED NONE DETECTED   Benzodiazepines NONE DETECTED NONE DETECTED   Amphetamines NONE DETECTED NONE DETECTED   Tetrahydrocannabinol NONE DETECTED NONE DETECTED   Barbiturates NONE DETECTED NONE DETECTED    Comment:        DRUG SCREEN FOR MEDICAL PURPOSES ONLY.  IF CONFIRMATION IS NEEDED FOR ANY PURPOSE, NOTIFY LAB WITHIN 5 DAYS.        LOWEST DETECTABLE LIMITS FOR URINE DRUG SCREEN Drug Class       Cutoff (ng/mL) Amphetamine      1000 Barbiturate      200 Benzodiazepine   330 Tricyclics       076 Opiates          300 Cocaine          300 THC              50    Comprehensive metabolic panel     Status: Abnormal   Collection Time: 10/04/15  7:52 PM  Result Value Ref Range   Sodium 140 135 - 145 mmol/L   Potassium 3.2 (L) 3.5 - 5.1 mmol/L   Chloride 103 101 - 111 mmol/L   CO2 29 22 - 32 mmol/L   Glucose, Bld 76 65 - 99 mg/dL   BUN <5 (L) 6 - 20 mg/dL   Creatinine, Ser 0.87 0.61 - 1.24 mg/dL   Calcium 9.2 8.9 - 10.3 mg/dL   Total Protein 7.2 6.5 - 8.1 g/dL   Albumin 3.8 3.5 - 5.0 g/dL   AST 19 15 - 41 U/L   ALT 15 (L) 17 - 63 U/L   Alkaline Phosphatase 76 38 - 126 U/L   Total Bilirubin 0.3 0.3 - 1.2 mg/dL   GFR calc non Af Amer >60 >60 mL/min   GFR calc Af Amer >60 >60 mL/min    Comment: (NOTE) The eGFR has been calculated using the CKD EPI equation. This calculation has not been validated in all clinical situations. eGFR's persistently <60 mL/min signify possible Chronic Kidney Disease.    Anion gap 8 5 - 15  cbc     Status: Abnormal   Collection Time: 10/04/15  7:52 PM  Result Value Ref Range   WBC 11.8 (H) 4.0 - 10.5 K/uL   RBC 4.72 4.22 - 5.81 MIL/uL   Hemoglobin 13.7 13.0 - 17.0 g/dL   HCT 42.7 39.0 - 52.0 %   MCV 90.5 78.0 - 100.0 fL   MCH 29.0 26.0 - 34.0 pg   MCHC 32.1 30.0 - 36.0 g/dL   RDW 13.8 11.5 - 15.5 %   Platelets 286 150 - 400 K/uL    Blood Alcohol level:  Lab Results  Component Value Date   ETH <5 10/04/2015   ETH <5 22/63/3354    Metabolic Disorder Labs:  No results found for: HGBA1C, MPG No results found for: PROLACTIN No results found for: CHOL, TRIG, HDL, CHOLHDL, VLDL, LDLCALC  Current Medications: Current Facility-Administered Medications  Medication Dose Route Frequency Provider Last Rate Last  Dose  . acetaminophen (TYLENOL) tablet 650 mg  650 mg Oral Q6H PRN Kerrie Buffalo, NP      . alum & mag hydroxide-simeth (MAALOX/MYLANTA) 200-200-20 MG/5ML suspension 30 mL  30 mL Oral Q4H PRN Kerrie Buffalo, NP      . clonazePAM Bobbye [place]) tablet 0.25 mg  0.25 mg Oral BID Norman Clay, MD      .  cloNIDine (CATAPRES) tablet 0.1 mg  0.1 mg Oral QID Norman Clay, MD   0.1 mg at 10/05/15 1306   Followed by  . [START ON 10/07/2015] cloNIDine (CATAPRES) tablet 0.1 mg  0.1 mg Oral BH-qamhs Norman Clay, MD       Followed by  . [START ON 10/09/2015] cloNIDine (CATAPRES) tablet 0.1 mg  0.1 mg Oral QAC breakfast Norman Clay, MD      . dicyclomine (BENTYL) tablet 20 mg  20 mg Oral Q6H PRN Norman Clay, MD      . feeding supplement (ENSURE ENLIVE) (ENSURE ENLIVE) liquid 237 mL  237 mL Oral BID BM Norman Clay, MD      . gabapentin (NEURONTIN) capsule 300 mg  300 mg Oral TID Norman Clay, MD   300 mg at 10/05/15 1304  . hydrOXYzine (ATARAX/VISTARIL) tablet 25 mg  25 mg Oral Q6H PRN Norman Clay, MD   25 mg at 10/05/15 1304  . loperamide (IMODIUM) capsule 2-4 mg  2-4 mg Oral PRN Norman Clay, MD      . magnesium hydroxide (MILK OF MAGNESIA) suspension 30 mL  30 mL Oral Daily PRN Kerrie Buffalo, NP      . methocarbamol (ROBAXIN) tablet 500 mg  500 mg Oral Q8H PRN Norman Clay, MD      . naproxen (NAPROSYN) tablet 500 mg  500 mg Oral BID PRN Norman Clay, MD   500 mg at 10/05/15 1303  . nicotine (NICODERM CQ - dosed in mg/24 hours) patch 21 mg  21 mg Transdermal Daily Norman Clay, MD   21 mg at 10/05/15 1303  . ondansetron (ZOFRAN-ODT) disintegrating tablet 4 mg  4 mg Oral Q6H PRN Norman Clay, MD      . sertraline (ZOLOFT) tablet 25 mg  25 mg Oral Daily Norman Clay, MD   25 mg at 10/05/15 1304  . traZODone (DESYREL) tablet 50 mg  50 mg Oral QHS Norman Clay, MD       PTA Medications: Prescriptions Prior to Admission  Medication Sig Dispense Refill Last Dose  . LORazepam (ATIVAN) 1 MG tablet Take 1 tablet (1 mg total) by mouth 3 (three) times daily as needed for anxiety. 20 tablet 0 Past Week at Unknown time    Musculoskeletal: Strength & Muscle Tone: within normal limits Gait & Station: normal Patient leans: N/A  Psychiatric Specialty Exam: Physical Exam  Review of Systems  Constitutional:  Positive for chills and diaphoresis.  Cardiovascular: Positive for chest pain and palpitations.  Gastrointestinal: Positive for abdominal pain and nausea. Negative for diarrhea.  Neurological: Positive for tremors and headaches. Negative for dizziness.  Psychiatric/Behavioral: Positive for depression, hallucinations and suicidal ideas. The patient has insomnia.     Blood pressure (!) 145/99, pulse (!) 113, temperature 98.7 F (37.1 C), temperature source Oral, resp. rate 16, height '5\' 9"'  (1.753 m), weight 157 lb 12 oz (71.6 kg).Body mass index is 23.3 kg/m.  General Appearance: Fairly Groomed  Eye Contact:  Fair  Speech:  Normal Rate  Volume:  Normal  Mood:  "ancy"  Affect:  Restricted  Thought Process:  Coherent  and Goal Directed  Orientation:  Full (Time, Place, and Person)  Thought Content:  Logical  Suicidal Thoughts:  Yes.  without intent/plan  Homicidal Thoughts:  No  Memory:  intact  Judgement:  Fair  Insight:  Present  Psychomotor Activity:  Normal  Concentration:  Concentration: Fair and Attention Span: Fair  Recall:  [employer] of Knowledge:  Fair  Language:  Fair  Akathisia:  No  Handed:  Right  AIMS (if indicated):     Assets:  Communication Skills Desire for Improvement  ADL's:  Intact  Cognition:  WNL  Sleep:      Assessment Mr. Hanna is a 40 year old with polysubstance use disorder (methamphetamine, Suboxone), self reported history of seizure, RA, SLE who presented for detox.   # Methamphetamine use disorder # Opioid use disorder Patient is motivated for sobriety. He endorses physical symptoms of withdrawal as described above. Will start clonidine protocol. Will have scheduled clonazepam with plan to taper off.   # Substance induced mood disorder # r/o Unspecified anxiety disorder # MDD Patient endorses neurovegetative symptoms and anxiety. Will start sertraline to target his mood. Will start gabapentin which he used to take for his "seizure" disorder;  will benefit for his anxiety.   Plans - Start sertraline 25 mg daily - Start clonidine protocol - Start gabapentin 300 mg TID - Start clonazepam 0.25 mg BID with plan to taper off - Trazodone 50 mg qhs for insomnia - Check TSH - - Admit for crisis management and stabilization. - Medication management to reduce current symptoms to base line and improve the patient's overall level of functioning. - Monitor for the adverse effect of the medications and anger outbursts - Continue 15 minutes observation for safety concerns - Encouraged to participate in milieu therapy and group therapy counseling sessions and also work with coping skills -  Develop treatment plan to decrease risk of relapse upon discharge and to reduce the need for readmission. -  Psycho-social education regarding relapse prevention and self care. - Health care follow up as needed for medical problems. - Restart home medications where appropriate.  Treatment Plan Summary: Daily contact with patient to assess and evaluate symptoms and progress in treatment  Observation Level/Precautions:  Detox 15 minute checks  Laboratory:  TSH  Psychotherapy:  Individual and group therapy  Medications:  As above  Consultations:  As needed  Discharge Concerns:  -  Estimated LOS:5-7 days  Other:     I certify that inpatient services furnished can reasonably be expected to improve the patient's condition.    Norman Clay, MD 8/30/20171:55 PM

## 2015-10-05 NOTE — ED Notes (Signed)
Pt up to bathroom.

## 2015-10-05 NOTE — ED Notes (Signed)
Pt called out and stated that he is having abdominal pain and nausea. Dr Blinda Leatherwood notified and to place new orders. VSS.
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
## 2015-10-05 NOTE — Progress Notes (Signed)
Kenneth Elliott is a 40 y.o. male voluntarily admitted for SI with plan to inject heroine into an artery, and detox from heroine, and polysubstance abuse (heroine, opiates, methamphetamine, suboxone, and cocaine).  Last use of all substances was Saturday 8/26 per patient.  Admitted  from [facility] ED.  No known allergies. Medical and surgical history reviewed and noted below, pertinent history includes HTN, lupus, RA, and seizures (all self reported).  Lives with parents in [place]. Basic search of patient completed with skin check completed, no contraband found, skin findings documented on flowsheet.  C/O pain in bottom of foot where OA is "I think I stepped on some glass."  Belongings reviewed and noted on belongings record.  Oriented to unit and rules, consents and treatment agreement reviewed with patient and signed.  Patient denies active SI, HI, and AVH, agrees to come to staff before acting out or acting on any harmful thoughts.  No Known Allergies Past Medical History:  Diagnosis Date  . Hypertension   . Lupus (HCC)   . Rheumatoid arthritis (HCC)   . Seizures (HCC)    per patient   No past surgical history on file.

## 2015-10-05 NOTE — ED Notes (Signed)
Pt signed for transport. Pt requesting ativan. PELM called and OTW. EDP to fill out EMTALA.
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
## 2015-10-05 NOTE — Tx Team (Signed)
Initial Treatment Plan 10/05/2015 11:58 AM [name] [id number]    PATIENT STRESSORS: Financial difficulties Substance abuse   PATIENT STRENGTHS: Ability for insight Active sense of humor General fund of knowledge Motivation for treatment/growth Physical Health Religious Affiliation Supportive family/friends   PATIENT IDENTIFIED PROBLEMS: "Drug abuse"  "getting better so I can maintain"  Anxiety                 DISCHARGE CRITERIA:  Ability to meet basic life and health needs Adequate post-discharge living arrangements Improved stabilization in mood, thinking, and/or behavior Medical problems require only outpatient monitoring Motivation to continue treatment in a less acute level of care Need for constant or close observation no longer present Reduction of life-threatening or endangering symptoms to within safe limits Safe-care adequate arrangements made Verbal commitment to aftercare and medication compliance Withdrawal symptoms are absent or subacute and managed without 24-hour nursing intervention  PRELIMINARY DISCHARGE PLAN: Outpatient therapy Return to previous living arrangement  PATIENT/FAMILY INVOLVEMENT: This treatment plan has been presented to and reviewed with the patient, Kenneth Elliott, and/or family member.  The patient and family have been given the opportunity to ask questions and make suggestions.  Lindajo Royal, RN 10/05/2015, 11:58 AM

## 2015-10-05 NOTE — Progress Notes (Signed)
Per Donell Sievert, PA: Pt meets IP critieria. Recommend IP tx.  Per Clint Bolder, AC: Accepted to [facility], Room 307-1, Accepting Dr. Jama Flavors.            Lance Muss, LCSW [facility] ED/58M Clinical Social Worker [phone]

## 2015-10-05 NOTE — [facility] Suicide Risk Assessment (Signed)
[name] INPATIENT:  Family/Significant Other Suicide Prevention Education  Suicide Prevention Education:  Education Completed; [name], mother, [phone],  (name of family member/significant other) has been identified by the patient as the family member/significant other with whom the patient will be residing, and identified as the person(s) who will aid the patient in the event of a mental health crisis (suicidal ideations/suicide attempt).  With written consent from the patient, the family member/significant other has been provided the following suicide prevention education, prior to the and/or following the discharge of the patient.  The suicide prevention education provided includes the following:  Suicide risk factors  Suicide prevention and interventions  National Suicide Hotline telephone number  [facility] assessment telephone number  [facility] Emergency Assistance 911  [facility] and/or Residential Mobile Crisis Unit telephone number  Request made of family/significant other to:  Remove weapons (e.g., guns, rifles, knives), all items previously/currently identified as safety concern.    Remove drugs/medications (over-the-counter, prescriptions, illicit drugs), all items previously/currently identified as a safety concern.  The family member/significant other verbalizes understanding of the suicide prevention education information provided.  The family member/significant other agrees to remove the items of safety concern listed above.  Mother states she has guns in the home which are secured.  Does not intend to have patient return to their house.  States family is "very much on guard" when he is at home.   Sallee Lange 10/05/2015, 12:01 PM
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
## 2015-10-06 LAB — TSH: TSH: 1.007 u[IU]/mL (ref 0.350–4.500)

## 2015-10-06 MED ORDER — SERTRALINE HCL 50 MG PO TABS
50.0000 mg | ORAL_TABLET | Freq: Every day | ORAL | Status: DC
  Administered 2015-10-07 – 2015-10-10 (×4): 50 mg via ORAL
  Filled 2015-10-06 (×5): qty 1

## 2015-10-06 MED ORDER — GABAPENTIN 400 MG PO CAPS
400.0000 mg | ORAL_CAPSULE | Freq: Three times a day (TID) | ORAL | Status: DC
  Administered 2015-10-06 – 2015-10-07 (×4): 400 mg via ORAL
  Filled 2015-10-06 (×10): qty 1

## 2015-10-06 NOTE — Progress Notes (Signed)
[facility] MD Progress Note  10/06/2015 11:19 AM Kenneth Elliott  MRN:  [id number] Subjective:   He states that he feels better compared to yesterday. He still has physical complaints of myalgia. He has been feeling anxious and endorses insomnia last night. He has occasional passive SI. He denies AH/VH.   Principal Problem: Polysubstance dependence (HCC) Diagnosis:   Patient Active Problem List   Diagnosis Date Noted  . MDD (major depressive disorder) (HCC) [F32.9] 10/05/2015  . Polysubstance dependence (HCC) [F19.20] 10/05/2015   Total Time spent with patient: 20 minutes  Past Psychiatric History: see HPI  Past Medical History:  Past Medical History:  Diagnosis Date  . Hypertension   . Lupus (HCC)   . Rheumatoid arthritis (HCC)   . Seizures (HCC)    per patient   History reviewed. No pertinent surgical history. Family History: History reviewed. No pertinent family history. Family Psychiatric  History: see HPI Social History:  History  Alcohol Use  . Yes     History  Drug use: Unknown    Social History   Social History  . Marital status: Divorced    Spouse name: N/A  . Number of children: N/A  . Years of education: N/A   Social History Main Topics  . Smoking status: Current Every Day Smoker    Packs/day: 1.00    Types: Cigarettes  . Smokeless tobacco: Current User  . Alcohol use Yes  . Drug use: Unknown  . Sexual activity: Not Currently   Other Topics Concern  . None   Social History Narrative  . None   Additional Social History:    Pain Medications: Reports abuse of Rx opiates occasionally Prescriptions: Reports ativan daily Over the Counter: none History of alcohol / drug use?: Yes Longest period of sobriety (when/how long): 10 years currently Negative Consequences of Use: Financial Withdrawal Symptoms: Agitation, Cramps, Tachycardia Name of Substance 1: Methamphetamine 1 - Age of First Use: 37 1 - Amount (size/oz): unknown-varies 1 - Frequency:  daily 1 - Duration: years 1 - Last Use / Amount: last Saturday-2 days ago Name of Substance 2: Heroine 2 - Age of First Use: 40 2 - Amount (size/oz): unknown 2 - Frequency: tried 1st time last Saturday 2 - Last Use / Amount: 2 days ago Name of Substance 3: Cocaine 3 - Age of First Use: 49s 3 - Amount (size/oz): unknown 3 - Frequency: 3-4 times a week 3 - Duration: years 3 - Last Use / Amount: 2 days ago Name of Substance 4: Nicotine/Cigarettes 4 - Age of First Use: 14 4 - Amount (size/oz): 1 pack per day 4 - Duration: ongoing 4 - Last Use / Amount: 10/04/15   Name of Substance 6: Suboxone 6 - Age of First Use: 32 6 - Amount (size/oz): unknown 6 - Frequency: daily 6 - Duration: 3  6 - Last Use / Amount: stopped 4-5 yrs ago        Sleep: Poor  Appetite:  Good  Current Medications: Current Facility-Administered Medications  Medication Dose Route Frequency Provider Last Rate Last Dose  . acetaminophen (TYLENOL) tablet 650 mg  650 mg Oral Q6H PRN Adonis Brook, NP      . alum & mag hydroxide-simeth (MAALOX/MYLANTA) 200-200-20 MG/5ML suspension 30 mL  30 mL Oral Q4H PRN Adonis Brook, NP   30 mL at 10/05/15 1834  . clonazePAM (KLONOPIN) tablet 0.25 mg  0.25 mg Oral BID Neysa Hotter, MD   0.25 mg at 10/06/15 0754  . cloNIDine (  CATAPRES) tablet 0.1 mg  0.1 mg Oral QID Neysa Hotter, MD   0.1 mg at 10/06/15 0756   Followed by  . [START ON 10/07/2015] cloNIDine (CATAPRES) tablet 0.1 mg  0.1 mg Oral BH-qamhs Neysa Hotter, MD       Followed by  . [START ON 10/09/2015] cloNIDine (CATAPRES) tablet 0.1 mg  0.1 mg Oral QAC breakfast Neysa Hotter, MD      . dicyclomine (BENTYL) tablet 20 mg  20 mg Oral Q6H PRN Neysa Hotter, MD      . feeding supplement (ENSURE ENLIVE) (ENSURE ENLIVE) liquid 237 mL  237 mL Oral BID BM Neysa Hotter, MD   237 mL at 10/06/15 1046  . gabapentin (NEURONTIN) capsule 300 mg  300 mg Oral TID Neysa Hotter, MD   300 mg at 10/06/15 0755  . hydrOXYzine  (ATARAX/VISTARIL) tablet 25 mg  25 mg Oral Q6H PRN Neysa Hotter, MD   25 mg at 10/05/15 2246  . loperamide (IMODIUM) capsule 2-4 mg  2-4 mg Oral PRN Neysa Hotter, MD      . magnesium hydroxide (MILK OF MAGNESIA) suspension 30 mL  30 mL Oral Daily PRN Adonis Brook, NP      . methocarbamol (ROBAXIN) tablet 500 mg  500 mg Oral Q8H PRN Neysa Hotter, MD      . naproxen (NAPROSYN) tablet 500 mg  500 mg Oral BID PRN Neysa Hotter, MD   500 mg at 10/05/15 1303  . nicotine (NICODERM CQ - dosed in mg/24 hours) patch 21 mg  21 mg Transdermal Daily Neysa Hotter, MD   21 mg at 10/06/15 0758  . ondansetron (ZOFRAN-ODT) disintegrating tablet 4 mg  4 mg Oral Q6H PRN Neysa Hotter, MD      . sertraline (ZOLOFT) tablet 25 mg  25 mg Oral Daily Neysa Hotter, MD   25 mg at 10/06/15 0755  . traZODone (DESYREL) tablet 50 mg  50 mg Oral QHS Neysa Hotter, MD   50 mg at 10/05/15 2246    Lab Results:  Results for orders placed or performed during the hospital encounter of 10/05/15 (from the past 48 hour(s))  TSH     Status: None   Collection Time: 10/06/15  6:45 AM  Result Value Ref Range   TSH 1.007 0.350 - 4.500 uIU/mL    Comment: Performed at [facility]    Blood Alcohol level:  Lab Results  Component Value Date   [facility] <5 10/04/2015   ETH <5 09/28/2015    Metabolic Disorder Labs: No results found for: HGBA1C, MPG No results found for: PROLACTIN No results found for: CHOL, TRIG, HDL, CHOLHDL, VLDL, LDLCALC  Physical Findings: AIMS: Facial and Oral Movements Muscles of Facial Expression: None, normal Lips and Perioral Area: None, normal Jaw: None, normal Tongue: None, normal,Extremity Movements Upper (arms, wrists, hands, fingers): None, normal Lower (legs, knees, ankles, toes): None, normal, Trunk Movements Neck, shoulders, hips: None, normal, Overall Severity Severity of abnormal movements (highest score from questions above): None, normal Incapacitation due to abnormal movements:  None, normal Patient's awareness of abnormal movements (rate only patient's report): No Awareness, Dental Status Current problems with teeth and/or dentures?: No Does patient usually wear dentures?: No  CIWA:    COWS:  COWS Total Score: 5  Musculoskeletal: Strength & Muscle Tone: within normal limits Gait & Station: normal Patient leans: N/A  Psychiatric Specialty Exam: Physical Exam  Review of Systems  Cardiovascular: Positive for chest pain and palpitations.  Gastrointestinal: Positive for abdominal  pain and nausea. Negative for diarrhea.  Musculoskeletal: Positive for joint pain and myalgias.  Psychiatric/Behavioral: Positive for depression. Negative for suicidal ideas. The patient is nervous/anxious and has insomnia.     Blood pressure 114/72, pulse 90, temperature 97.6 F (36.4 C), temperature source Oral, resp. rate 18, height 5\' 9"  (1.753 m), weight 157 lb 12 oz (71.6 kg).Body mass index is 23.3 kg/m.  General Appearance: Casual  Eye Contact:  Fair  Speech:  Clear and Coherent  Volume:  Normal  Mood:  Anxious  Affect:  Blunt  Thought Process:  Coherent and Goal Directed  Orientation:  Full (Time, Place, and Person)  Thought Content:  Logical Perceptions: denies AH/VH  Suicidal Thoughts:  Yes.  without intent/plan  Homicidal Thoughts:  No  Memory:  intact to recent and remote history  Judgement:  Fair  Insight:  Fair  Psychomotor Activity:  Normal  Concentration:  Concentration: Fair and Attention Span: Fair  Recall:  of Knowledge:  Fair  Language:  Fair  Akathisia:  No  Handed:  Right  AIMS (if indicated):     Assets:  Communication Skills Desire for Improvement  ADL's:  Intact  Cognition:  WNL  Sleep:      Assessment Mr. Lederman is a 40 year old with polysubstance use disorder (methamphetamine, Suboxone), self reported history of seizure, RA, SLE who presented for detox.   # Methamphetamine use disorder # Opioid use disorder Patient is  motivated for sobriety. He endorses physical symptoms of withdrawal as described above. Will continue clonidine protocol.   # MDD # r/o Substance induced mood disorder # r/o Unspecified anxiety disorder Patient continues to endorse neurovegetative symptoms and anxiety. Will increase sertraline to target his mood. Will increase gabapentin which would be also beneficial for his pain. Will continue clonazepam at this time; which was switched from Ativan to avoid risk of dependence.    Plans - Increase sertraline 50 mg daily - Start clonidine protocol - Increase gabapentin 400 mg TID - Continue clonazepam 0.25 mg BID - Trazodone 50 mg qhs for insomnia - TSH reviewed; 1.007 - - Admit for crisis management and stabilization. - Medication management to reduce current symptoms to base line and improve the patient's overall level of functioning. - Monitor for the adverse effect of the medications and anger outbursts - Continue 15 minutes observation for safety concerns - Encouraged to participate in milieu therapy and group therapy counseling sessions and also work with coping skills -  Develop treatment plan to decrease risk of relapse upon discharge and to reduce the need for readmission. -  Psycho-social education regarding relapse prevention and self care. - Health care follow up as needed for medical problems. - Restart home medications where appropriate.  Treatment Plan Summary: Daily contact with patient to assess and evaluate symptoms and progress in treatment  41, MD 10/06/2015, 11:19 AM

## 2015-10-06 NOTE — Progress Notes (Signed)
NUTRITION ASSESSMENT  Pt identified as at risk on the Malnutrition Screen Tool  INTERVENTION: 1. Supplements: Continue Ensure Enlive po BID, each supplement provides 350 kcal and 20 grams of protein  NUTRITION DIAGNOSIS: Unintentional weight loss related to sub-optimal intake as evidenced by pt report.   Goal: Pt to meet >/= 90% of their estimated nutrition needs.  Monitor:  PO intake  Assessment:  Pt admitted with polysubstance abuse (heroin, opiates, methamphetamine, suboxone, cocaine). Pt reports weight loss of 25 lb over the last 1.5 months, 14% wt loss x 1.5 months, which is significant for time frame. Pt has been ordered Ensure supplements.   Height: Ht Readings from Last 1 Encounters:  10/05/15 5\' 9"  (1.753 m)    Weight: Wt Readings from Last 1 Encounters:  10/05/15 157 lb 12 oz (71.6 kg)    Weight Hx: Wt Readings from Last 10 Encounters:  10/05/15 157 lb 12 oz (71.6 kg)  10/04/15 160 lb 4.8 oz (72.7 kg)    BMI:  Body mass index is 23.3 kg/m. Pt meets criteria for normal range based on current BMI.  Estimated Nutritional Needs: Kcal: 25-30 kcal/kg Protein: > 1 gram protein/kg Fluid: 1 ml/kcal  Diet Order: Diet regular Room service appropriate? Yes; Fluid consistency: Thin Pt is also offered choice of unit snacks mid-morning and mid-afternoon.  Pt is eating as desired.   Lab results and medications reviewed.   10/06/15, MS, RD, LDN Pager: [phone] After Hours Pager: [phone]

## 2015-10-06 NOTE — Progress Notes (Signed)
D: Patient up and visible in the milieu. Spoke with patient 1:1. Rates sleep fair, appetite fair, energy low and concentration poor. Patient's affect anxious and depressed with congruent mood. Rating his depression at a 6/10, hopelessness at a 5/10 and anxiety at a 7/10. States goal for today is to "work on my attitude towards people." Patient complaining of chronic bilat hand pain due to his arthritis Rates it as a 5/10.   A: Medicated per orders, refuses prn pain medicine. Emotional support offered and self inventory reviewed. Fall precautions in place and reviewed with patient (patient has seizure disorder.)   R: Patient verbalizes understanding. Patient endorsing passive SI "off and on" but verbally contracts for safety. No HI and remains safe on level III obs.

## 2015-10-06 NOTE — [facility] Group Notes (Signed)
[name] LCSW Group Therapy 10/06/2015  1:15 PM   Type of Therapy: Group Therapy  Participation Level: Did Not Attend. Patient invited to participate but declined.   Samuella Bruin, MSW, LCSW Clinical Social Worker [place] [phone]

## 2015-10-06 NOTE — Tx Team (Signed)
Interdisciplinary Treatment and Diagnostic Plan Update  10/06/2015 Time of Session: 9:30am Kenneth Elliott MRN: [id number]  Principal Diagnosis: Polysubstance dependence (HCC)  Secondary Diagnoses: Principal Problem:   Polysubstance dependence (HCC) Active Problems:   MDD (major depressive disorder) (HCC)   Current Medications:  Current Facility-Administered Medications  Medication Dose Route Frequency Provider Last Rate Last Dose  . acetaminophen (TYLENOL) tablet 650 mg  650 mg Oral Q6H PRN Sheila Agustin, NP      . alum & mag hydroxide-simeth (MAALOX/MYLANTA) 200-200-20 MG/5ML suspension 30 mL  30 mL Oral Q4H PRN Sheila Agustin, NP   30 mL at 10/05/15 1834  . clonazePAM (KLONOPIN) tablet 0.25 mg  0.25 mg Oral BID Reina Hisada, MD   0.25 mg at 10/06/15 0754  . cloNIDine (CATAPRES) tablet 0.1 mg  0.1 mg Oral QID Reina Hisada, MD   0.1 mg at 10/06/15 0756   Followed by  . [START ON 10/07/2015] cloNIDine (CATAPRES) tablet 0.1 mg  0.1 mg Oral BH-qamhs Reina Hisada, MD       Followed by  . [START ON 10/09/2015] cloNIDine (CATAPRES) tablet 0.1 mg  0.1 mg Oral QAC breakfast Reina Hisada, MD      . dicyclomine (BENTYL) tablet 20 mg  20 mg Oral Q6H PRN Reina Hisada, MD      . feeding supplement (ENSURE ENLIVE) (ENSURE ENLIVE) liquid 237 mL  237 mL Oral BID BM Reina Hisada, MD   237 mL at 10/05/15 1500  . gabapentin (NEURONTIN) capsule 300 mg  300 mg Oral TID Reina Hisada, MD   300 mg at 10/06/15 0755  . hydrOXYzine (ATARAX/VISTARIL) tablet 25 mg  25 mg Oral Q6H PRN Reina Hisada, MD   25 mg at 10/05/15 2246  . loperamide (IMODIUM) capsule 2-4 mg  2-4 mg Oral PRN Reina Hisada, MD      . magnesium hydroxide (MILK OF MAGNESIA) suspension 30 mL  30 mL Oral Daily PRN Sheila Agustin, NP      . methocarbamol (ROBAXIN) tablet 500 mg  500 mg Oral Q8H PRN Reina Hisada, MD      . naproxen (NAPROSYN) tablet 500 mg  500 mg Oral BID PRN Reina Hisada, MD   500 mg at 10/05/15 1303  . nicotine (NICODERM CQ -  dosed in mg/24 hours) patch 21 mg  21 mg Transdermal Daily Reina Hisada, MD   21 mg at 10/06/15 0758  . ondansetron (ZOFRAN-ODT) disintegrating tablet 4 mg  4 mg Oral Q6H PRN Reina Hisada, MD      . sertraline (ZOLOFT) tablet 25 mg  25 mg Oral Daily Reina Hisada, MD   25 mg at 10/06/15 0755  . traZODone (DESYREL) tablet 50 mg  50 mg Oral QHS Reina Hisada, MD   50 mg at 10/05/15 2246   PTA Medications: Prescriptions Prior to Admission  Medication Sig Dispense Refill Last Dose  . LORazepam (ATIVAN) 1 MG tablet Take 1 tablet (1 mg total) by mouth 3 (three) times daily as needed for anxiety. 20 tablet 0 Past Week at Unknown time    Treatment Modalities: Medication Management, Group therapy, Case management,  1 to 1 session with clinician, Psychoeducation, Recreational therapy.   Physician Treatment Plan for Primary Diagnosis: Polysubstance dependence (HCC) Long Term Goal(s): Improvement in symptoms so as ready for discharge   Short Term Goals: Ability to identify changes in lifestyle to reduce recurrence of condition will improve and Ability to identify triggers associated with substance abuse/mental health issues will improve  Medication Management: Evaluate   patient's response, side effects, and tolerance of medication regimen.  Therapeutic Interventions: 1 to 1 sessions, Unit Group sessions and Medication administration.  Evaluation of Outcomes: Not Met  Physician Treatment Plan for Secondary Diagnosis: Principal Problem:   Polysubstance dependence ([place]) Active Problems:   MDD (major depressive disorder) ([place])  Long Term Goal(s): Improvement in symptoms so as ready for discharge  Short Term Goals: Ability to identify changes in lifestyle to reduce recurrence of condition will improve and Ability to identify triggers associated with substance abuse/mental health issues will improve  Medication Management: Evaluate patient's response, side effects, and tolerance of medication  regimen.  Therapeutic Interventions: 1 to 1 sessions, Unit Group sessions and Medication administration.  Evaluation of Outcomes: Not Met   RN Treatment Plan for Primary Diagnosis: Polysubstance dependence ([place]) Long Term Goal(s): Knowledge of disease and therapeutic regimen to maintain health will improve  Short Term Goals: Ability to verbalize feelings will improve and Ability to identify and develop effective coping behaviors will improve  Medication Management: RN will administer medications as ordered by provider, will assess and evaluate patient's response and provide education to patient for prescribed medication. RN will report any adverse and/or side effects to prescribing provider.  Therapeutic Interventions: 1 on 1 counseling sessions, Psychoeducation, Medication administration, Evaluate responses to treatment, Monitor vital signs and CBGs as ordered, Perform/monitor CIWA, COWS, AIMS and Fall Risk screenings as ordered, Perform wound care treatments as ordered.  Evaluation of Outcomes: Not Met   LCSW Treatment Plan for Primary Diagnosis: Polysubstance dependence ([place]) Long Term Goal(s): Safe transition to appropriate next level of care at discharge, Engage patient in therapeutic group addressing interpersonal concerns.  Short Term Goals: Engage patient in aftercare planning with referrals and resources, Identify triggers associated with mental health/substance abuse issues and Increase skills for wellness and recovery  Therapeutic Interventions: Assess for all discharge needs, 1 to 1 time with Social worker, Explore available resources and support systems, Assess for adequacy in community support network, Educate family and significant other(s) on suicide prevention, Complete Psychosocial Assessment, Interpersonal group therapy.  Evaluation of Outcomes: Not Met   Progress in Treatment :  Attending groups: Continuing to assess  Participating in groups: Continuing to  assess  Taking medication as prescribed: Yes, MD continuing to assess for appropriate medication regimen  Toleration medication: Yes  Family/Significant other contact made: Yes, CSW has spoken with patient's mother  Patient understands diagnosis: Yes  Discussing patient identified problems/goals with staff: Yes  Medical problems stabilized or resolved: Yes  Denies suicidal/homicidal ideation: Treatment team continuing to asses  Issues/concerns per patient self-inventory: None reported  Other: N/A  New problem(s) identified: None reported at this time    New Short Term/Long Term Goal(s): None at this time    Discharge Plan or Barriers: Patient reports interest in residential treatment at discharge. He is trying to verify his insurance so CSW can initiate referrals.     Reason for Continuation of Hospitalization: Anxiety Depression Hallucinations Mania Medication stabilization Suicidal ideation Withdrawal symptoms  Estimated Length of Stay: 2-3 days    Attendees:  Patient:   Physician: Dr. Modesta Messing , MD  10/06/2015   9:30am  Nursing: Candelaria Stagers 10/06/2015 9:30am  RN Care Manager: Lars Pinks, [place]  10/06/2015 9:30am  Social Workers: Erasmo Downer Zymeir Salminen, LCSW   10/06/2015 9:30am  Nurse Pratictioners:   Other:     Scribe for Treatment Team: Tilden Fossa, [place] Worker [facility] [phone]

## 2015-10-06 NOTE — Progress Notes (Signed)
CSW met briefly with patient to discuss his discharge plans. Patient was in bed, reports that he does not feel well and participated minimally in discussion. He states that he is interested in residential treatment. CSW informed him that his insurance needs to be verified before referrals can be made to treatment centers. He verbalized his understanding and agreed to contact his ex-wife this evening to get insurance information.  Tilden Fossa, LCSW Clinical Social Worker [facility] [phone]

## 2015-10-06 NOTE — Plan of Care (Signed)
Problem: Safety: Goal: Periods of time without injury will increase Outcome: Progressing Patient has not engaged in self harm. Does endorse on and off passive SI however verbally contracts for safety.  Problem: Medication: Goal: Compliance with prescribed medication regimen will improve Outcome: Progressing Patient has been med compliant.

## 2015-10-07 LAB — BASIC METABOLIC PANEL
ANION GAP: 6 (ref 5–15)
BUN: 18 mg/dL (ref 6–20)
CHLORIDE: 105 mmol/L (ref 101–111)
CO2: 29 mmol/L (ref 22–32)
Calcium: 9.1 mg/dL (ref 8.9–10.3)
Creatinine, Ser: 1 mg/dL (ref 0.61–1.24)
GFR calc non Af Amer: >60 mL/min (ref >60)
Glucose, Bld: 129 mg/dL — ABNORMAL HIGH (ref 65–99)
POTASSIUM: 4.7 mmol/L (ref 3.5–5.1)
SODIUM: 140 mmol/L (ref 135–145)

## 2015-10-07 MED ORDER — GABAPENTIN 400 MG PO CAPS
500.0000 mg | ORAL_CAPSULE | Freq: Three times a day (TID) | ORAL | Status: DC
  Administered 2015-10-07 – 2015-10-08 (×3): 500 mg via ORAL
  Filled 2015-10-07 (×9): qty 1

## 2015-10-07 MED ORDER — FAMOTIDINE 20 MG PO TABS
10.0000 mg | ORAL_TABLET | Freq: Two times a day (BID) | ORAL | Status: DC
  Administered 2015-10-07 – 2015-10-10 (×6): 10 mg via ORAL
  Filled 2015-10-07 (×8): qty 0.5
  Filled 2015-10-07: qty 1

## 2015-10-07 NOTE — Progress Notes (Signed)
Recreation Therapy Notes  Date: 10/07/15 Time: 0930 Location: 300 Hall Dayroom  Group Topic: Stress Management  Goal Area(s) Addresses:  Patient will verbalize importance of using healthy stress management.  Patient will identify positive emotions associated with healthy stress management.   Behavioral Response: Engaged  Intervention: Stress Management  Activity :  Guided Training and development officer.  LRT introduced the stress management technique guided imagery.  LRT read script that guided patients in how to perform the technique.  Patients were to follow along as LRT read the script to engage in the technique.  Education: Stress Management, Discharge Planning.   Education Outcome: Acknowledges edcuation/In group clarification offered/Needs additional education  Clinical Observations/Feedback: Pt attended group.   Caroll Rancher, LRT/CTRS         Lillia Abed, Nader Boys A 10/07/2015 1:08 PM

## 2015-10-07 NOTE — Plan of Care (Signed)
Problem: Safety: Goal: Ability to remain free from injury will improve Outcome: Progressing Q 15 minutes safety checks maintained on and off unit without self harm gestures thus far this shift.   Problem: Medication: Goal: Compliance with prescribed medication regimen will improve Outcome: Progressing Pt has been medication compliant. Denies adverse drug reactions when assessed.

## 2015-10-07 NOTE — [facility] Group Notes (Signed)
Patient did not attend group.

## 2015-10-07 NOTE — Progress Notes (Signed)
   D: Pt informed the writer that he wants to have "a 1:1 with the psychiatrist". Stated "I've really seen some things in my life". Pt stated "my grandfather killed my grandmother". 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 Pt has no other questions or concerns.   A:  Support and encouragement was offered. 15 min checks continued for safety.  R: Pt remains safe.

## 2015-10-07 NOTE — [facility] Group Notes (Signed)
[name] LCSW Group Therapy 10/07/2015 1:15 PM Type of Therapy: Group Therapy Participation Level: Active  Participation Quality: Attentive, Sharing and Supportive  Affect: Blunted  Cognitive: Alert and Oriented  Insight: Developing/Improving and Engaged  Engagement in Therapy: Developing/Improving and Engaged  Modes of Intervention: Clarification, Confrontation, Discussion, Education, Exploration, Limit-setting, Orientation, Problem-solving, Rapport Building, Dance movement psychotherapist, Socialization and Support  Summary of Progress/Problems: The topic for today was feelings about relapse. Pt discussed what relapse prevention is to them and identified triggers that they are on the path to relapse. Pt processed their feeling towards relapse and was able to relate to peers. Pt discussed coping skills that can be used for relapse prevention. Patient discussed the consequences of using in his life and the financial strain that it has caused his family. He left group early for unknown reason.    Samuella Bruin, MSW, LCSW Clinical Social Worker [facility] [phone]

## 2015-10-07 NOTE — Progress Notes (Signed)
Patient ID: Kenneth Elliott, male   DOB: 1975/09/29, 40 y.o.   MRN: [id number] [facility] MD Progress Note  10/07/2015 2:04 PM Kenneth Elliott  MRN:  [id number] Subjective:   Patient seen, chart reviewed and case discussed with nursing staff.   He states that he has been feeling anxious. He has worsening anxiety when he is by himself. He states that it has been a problem for him for many years. Last attack occurred last week. He continues to have some myalgia but denies significant distress. He is hoping to go to residential treatment.  Principal Problem: Polysubstance dependence (HCC) Diagnosis:   Patient Active Problem List   Diagnosis Date Noted  . MDD (major depressive disorder) (HCC) [F32.9] 10/05/2015  . Polysubstance dependence (HCC) [F19.20] 10/05/2015   Total Time spent with patient: 20 minutes  Past Psychiatric History: see HPI  Past Medical History:  Past Medical History:  Diagnosis Date  . Hypertension   . Lupus (HCC)   . Rheumatoid arthritis (HCC)   . Seizures (HCC)    per patient   History reviewed. No pertinent surgical history. Family History: History reviewed. No pertinent family history. Family Psychiatric  History: see HPI Social History:  History  Alcohol Use  . Yes     History  Drug use: Unknown    Social History   Social History  . Marital status: Divorced    Spouse name: N/A  . Number of children: N/A  . Years of education: N/A   Social History Main Topics  . Smoking status: Current Every Day Smoker    Packs/day: 1.00    Types: Cigarettes  . Smokeless tobacco: Current User  . Alcohol use Yes  . Drug use: Unknown  . Sexual activity: Not Currently   Other Topics Concern  . None   Social History Narrative  . None   Additional Social History:    Pain Medications: Reports abuse of Rx opiates occasionally Prescriptions: Reports ativan daily Over the Counter: none History of alcohol / drug use?: Yes Longest period of sobriety (when/how  long): 10 years currently Negative Consequences of Use: Financial Withdrawal Symptoms: Agitation, Cramps, Tachycardia Name of Substance 1: Methamphetamine 1 - Age of First Use: 37 1 - Amount (size/oz): unknown-varies 1 - Frequency: daily 1 - Duration: years 1 - Last Use / Amount: last Saturday-2 days ago Name of Substance 2: Heroine 2 - Age of First Use: 40 2 - Amount (size/oz): unknown 2 - Frequency: tried 1st time last Saturday 2 - Last Use / Amount: 2 days ago Name of Substance 3: Cocaine 3 - Age of First Use: 38s 3 - Amount (size/oz): unknown 3 - Frequency: 3-4 times a week 3 - Duration: years 3 - Last Use / Amount: 2 days ago Name of Substance 4: Nicotine/Cigarettes 4 - Age of First Use: 14 4 - Amount (size/oz): 1 pack per day 4 - Duration: ongoing 4 - Last Use / Amount: 10/04/15   Name of Substance 6: Suboxone 6 - Age of First Use: 32 6 - Amount (size/oz): unknown 6 - Frequency: daily 6 - Duration: 3  6 - Last Use / Amount: stopped 4-5 yrs ago        Sleep: Poor  Appetite:  Good  Current Medications: Current Facility-Administered Medications  Medication Dose Route Frequency Provider Last Rate Last Dose  . acetaminophen (TYLENOL) tablet 650 mg  650 mg Oral Q6H PRN Adonis Brook, NP      . alum & mag hydroxide-simeth (MAALOX/MYLANTA)  200-200-20 MG/5ML suspension 30 mL  30 mL Oral Q4H PRN Adonis Brook, NP   30 mL at 10/06/15 2130  . clonazePAM (KLONOPIN) tablet 0.25 mg  0.25 mg Oral BID Neysa Hotter, MD   0.25 mg at 10/07/15 0837  . cloNIDine (CATAPRES) tablet 0.1 mg  0.1 mg Oral BH-qamhs Neysa Hotter, MD   0.1 mg at 10/07/15 0840   Followed by  . [START ON 10/09/2015] cloNIDine (CATAPRES) tablet 0.1 mg  0.1 mg Oral QAC breakfast Neysa Hotter, MD      . dicyclomine (BENTYL) tablet 20 mg  20 mg Oral Q6H PRN Neysa Hotter, MD      . feeding supplement (ENSURE ENLIVE) (ENSURE ENLIVE) liquid 237 mL  237 mL Oral BID BM Neysa Hotter, MD   237 mL at 10/07/15 1042  .  gabapentin (NEURONTIN) capsule 400 mg  400 mg Oral TID Neysa Hotter, MD   400 mg at 10/07/15 1214  . hydrOXYzine (ATARAX/VISTARIL) tablet 25 mg  25 mg Oral Q6H PRN Neysa Hotter, MD   25 mg at 10/07/15 1039  . loperamide (IMODIUM) capsule 2-4 mg  2-4 mg Oral PRN Neysa Hotter, MD      . magnesium hydroxide (MILK OF MAGNESIA) suspension 30 mL  30 mL Oral Daily PRN Adonis Brook, NP      . methocarbamol (ROBAXIN) tablet 500 mg  500 mg Oral Q8H PRN Neysa Hotter, MD      . naproxen (NAPROSYN) tablet 500 mg  500 mg Oral BID PRN Neysa Hotter, MD   500 mg at 10/05/15 1303  . nicotine (NICODERM CQ - dosed in mg/24 hours) patch 21 mg  21 mg Transdermal Daily Neysa Hotter, MD   21 mg at 10/07/15 0849  . ondansetron (ZOFRAN-ODT) disintegrating tablet 4 mg  4 mg Oral Q6H PRN Neysa Hotter, MD      . sertraline (ZOLOFT) tablet 50 mg  50 mg Oral Daily Neysa Hotter, MD   50 mg at 10/07/15 0840  . traZODone (DESYREL) tablet 50 mg  50 mg Oral QHS Neysa Hotter, MD   50 mg at 10/06/15 2130    Lab Results:  Results for orders placed or performed during the hospital encounter of 10/05/15 (from the past 48 hour(s))  TSH     Status: None   Collection Time: 10/06/15  6:45 AM  Result Value Ref Range   TSH 1.007 0.350 - 4.500 uIU/mL    Comment: Performed at [facility]    Blood Alcohol level:  Lab Results  Component Value Date   [facility] <5 10/04/2015   ETH <5 09/28/2015    Metabolic Disorder Labs: No results found for: HGBA1C, MPG No results found for: PROLACTIN No results found for: CHOL, TRIG, HDL, CHOLHDL, VLDL, LDLCALC  Physical Findings: AIMS: Facial and Oral Movements Muscles of Facial Expression: None, normal Lips and Perioral Area: None, normal Jaw: None, normal Tongue: None, normal,Extremity Movements Upper (arms, wrists, hands, fingers): None, normal Lower (legs, knees, ankles, toes): None, normal, Trunk Movements Neck, shoulders, hips: None, normal, Overall Severity Severity  of abnormal movements (highest score from questions above): None, normal Incapacitation due to abnormal movements: None, normal Patient's awareness of abnormal movements (rate only patient's report): No Awareness, Dental Status Current problems with teeth and/or dentures?: No Does patient usually wear dentures?: No  CIWA:    COWS:  COWS Total Score: 6  Musculoskeletal: Strength & Muscle Tone: within normal limits Gait & Station: normal Patient leans: N/A  Psychiatric Specialty  Exam: Physical Exam  Review of Systems  Cardiovascular: Positive for palpitations. Negative for chest pain.  Gastrointestinal: Positive for abdominal pain and nausea. Negative for diarrhea.  Musculoskeletal: Positive for joint pain and myalgias.  Neurological: Positive for tremors.  Psychiatric/Behavioral: Positive for depression and suicidal ideas. The patient is nervous/anxious and has insomnia.     Blood pressure 133/85, pulse 100, temperature 98.4 F (36.9 C), temperature source Oral, resp. rate 20, height 5\' 9"  (1.753 m), weight 157 lb 12 oz (71.6 kg).Body mass index is 23.3 kg/m.  General Appearance: Casual  Eye Contact:  Fair  Speech:  Clear and Coherent  Volume:  Normal  Mood:  Anxious  Affect:  Restricted- improving  Thought Process:  Coherent and Goal Directed  Orientation:  Full (Time, Place, and Person)  Thought Content:  Logical Perceptions: denies AH/VH  Suicidal Thoughts:  Yes.  without intent/plan  Homicidal Thoughts:  No  Memory:  intact to recent and remote history  Judgement:  Fair  Insight:  Fair  Psychomotor Activity:  Normal  Concentration:  Concentration: Fair and Attention Span: Fair  Recall:  [employer]is[employer]Fair  Fund of Knowledge:  Fair  Language:  Fair  Akathisia:  No  Handed:  Right  AIMS (if indicated):     Assets:  Communication Skills Desire for Improvement  ADL's:  Intact  Cognition:  WNL  Sleep:  Number of Hours: 6.75   Assessment Mr. Donnie Ahoilley is a 40 year old with  polysubstance use disorder (methamphetamine, Suboxone), self reported history of seizure, RA, SLE who presented for detox.   # Methamphetamine use disorder # Opioid use disorder Patient is motivated for sobriety. He endorses physical symptoms of withdrawal as described above. Will continue clonidine protocol.   # MDD # r/o Substance induced mood disorder # r/o Unspecified anxiety disorder There has been an improvement in his neurovegetative symptoms but he continues to endorse anxiety. Will increase gabapentin to target both anxiety and pain. Will plan to slowly uptitrate sertraline. Will continue clonazepam at this time; which was switched from Ativan to avoid risk of dependence.   Plans - Continue sertraline 50 mg daily - Continue clonidine protocol - Increase gabapentin 500 mg TID - Continue clonazepam 0.25 mg BID - Trazodone 50 mg qhs for insomnia - TSH reviewed; 1.007 - - Admit for crisis management and stabilization. - Medication management to reduce current symptoms to base line and improve the patient's overall level of functioning. - Monitor for the adverse effect of the medications and anger outbursts - Continue 15 minutes observation for safety concerns - Encouraged to participate in milieu therapy and group therapy counseling sessions and also work with coping skills -  Develop treatment plan to decrease risk of relapse upon discharge and to reduce the need for readmission. -  Psycho-social education regarding relapse prevention and self care. - Health care follow up as needed for medical problems. - Restart home medications where appropriate.  Treatment Plan Summary: Daily contact with patient to assess and evaluate symptoms and progress in treatment  Neysa Hottereina Jawon Dipiero, MD 10/07/2015, 2:04 PM

## 2015-10-07 NOTE — Progress Notes (Signed)
D: Pt visible in milieu for majority of this shift. Observed interacting well with peers and staff. Denies SI, HI and AVH at present. "I'm just grateful for all y'all help so far". Rates his depression 5/10, hopelessness 6/10 and anxiety 6/10. Pt's goal for today is "learning how to love myself".  A: Scheduled and PRN medications given as ordered. Emotional support and availability provided to pt. Encouraged to voice concerns / needs. Safety maintained on Q 15 minutes checks without self harm gestures or outburst to note thus far this shift.  R: Pt receptive to care. Attends scheduled groups. Compliant with medication regimen, denies adverse drug reactions. Remains safe on and off unit.

## 2015-10-07 NOTE — Progress Notes (Addendum)
Referrals made to [facility], and [facility] at patient's request.   Samuella Bruin, LCSW Clinical Social Worker [facility] [phone]

## 2015-10-08 DIAGNOSIS — Z818 Family history of other mental and behavioral disorders: Secondary | ICD-10-CM

## 2015-10-08 DIAGNOSIS — Z813 Family history of other psychoactive substance abuse and dependence: Secondary | ICD-10-CM

## 2015-10-08 DIAGNOSIS — F17210 Nicotine dependence, cigarettes, uncomplicated: Secondary | ICD-10-CM

## 2015-10-08 MED ORDER — GABAPENTIN 300 MG PO CAPS
600.0000 mg | ORAL_CAPSULE | Freq: Three times a day (TID) | ORAL | Status: DC
  Administered 2015-10-08 – 2015-10-10 (×7): 600 mg via ORAL
  Filled 2015-10-08 (×12): qty 2

## 2015-10-08 MED ORDER — QUETIAPINE FUMARATE 100 MG PO TABS
100.0000 mg | ORAL_TABLET | Freq: Every day | ORAL | Status: DC
  Administered 2015-10-08 – 2015-10-09 (×2): 100 mg via ORAL
  Filled 2015-10-08 (×4): qty 1

## 2015-10-08 NOTE — Progress Notes (Signed)
D: Patient cheerful and cooperative. Seen interacting with peers. Denies pain, SI, AH/VH at this time. Made no new complaint. No behavioral issues noted.   A: Staff offered support and encouragement to patient. Due meds given as ordered. Every 15 minutes check for safety maintained. Will continue to monitor patient for safety and stability. Patient sleeping at this time. R: Patient remains safe.

## 2015-10-08 NOTE — [facility] Group Notes (Signed)
[name] Group Notes:  (Nursing/MHT/Case Management/Adjunct)  Date:  10/08/2015  Time:  6:01 PM  Type of Therapy:  Psychoeducational Skills  Participation Level:  Active  Participation Quality:  Attentive, Monopolizing and Redirectable  Affect:  Excited  Cognitive:  Alert and Oriented  Insight:  Lacking  Engagement in Group:  Engaged, Monopolizing and Supportive  Modes of Intervention:  Discussion and Exploration  Summary of Progress/Problems:participated in group identifying dangerous situations and new ways of coping with those situations. Patient placed a lot of blame on his wife for 'enabling' him and for allowing him to take money out of their account. Appeared to brag about his drug dealing prowess, but also made several statements about wanting to change his life with some insight into recovery and the need for changed behavior.   Vinetta Bergamo Eddi Hymes 10/08/2015, 6:01 PM

## 2015-10-08 NOTE — Progress Notes (Signed)
Patient did attend the evening speaker AA meeting.  

## 2015-10-08 NOTE — Plan of Care (Signed)
Problem: Safety: Goal: Ability to remain free from injury will improve Outcome: Progressing Patient has remained safe on the unit.   

## 2015-10-08 NOTE — Progress Notes (Signed)
Patient ID: HJALMAR BALLENGEE, male   DOB: 1975/04/15, 40 y.o.   MRN: [id number] Patient ID: GRAHAM HYUN, male   DOB: January 22, 1976, 40 y.o.   MRN: [id number] [facility] MD Progress Note  10/08/2015 3:14 PM MAREO PORTILLA  MRN:  [id number]  Subjective: Ishaan reports, "I have bad anxiety. I still am unable to sleep at night. My mind is like on a race, on going, won't slow down"  Objective: Patient seen, chart reviewed and case discussed with nursing staff. He states that he has been feeling anxious/agitaion. He has worsening anxiety when he is by himself. He states that it has been a problem for him for many years. Last attack occurred last week. He continues to have some myalgia but denies significant distress. He is endorsing racing thoughts, can't sleep at night. He is hoping to go to residential treatment ([facility]). Has added Seroquel & adjusted dose of Gabapentin. See MAR.  Principal Problem: Polysubstance dependence ([place]) Diagnosis:   Patient Active Problem List   Diagnosis Date Noted  . MDD (major depressive disorder) ([place]) [F32.9] 10/05/2015  . Polysubstance dependence ([place]) [F19.20] 10/05/2015   Total Time spent with patient: 15 minutes  Past Psychiatric History: see HPI  Past Medical History:  Past Medical History:  Diagnosis Date  . Hypertension   . Lupus ([place])   . Rheumatoid arthritis ([place])   . Seizures ([place])    per patient   History reviewed. No pertinent surgical history.  Family History: History reviewed. No pertinent family history.  Family Psychiatric  History: Schizophrenia, completed suicide, Bipolar disorder & polysubstance dependence,  Social History:  History  Alcohol Use  . Yes     History  Drug use: Unknown    Social History   Social History  . Marital status: Divorced    Spouse name: N/A  . Number of children: N/A  . Years of education: N/A   Social History Main Topics  . Smoking status: Current Every Day Smoker    Packs/day: 1.00     Types: Cigarettes  . Smokeless tobacco: Current User  . Alcohol use Yes  . Drug use: Unknown  . Sexual activity: Not Currently   Other Topics Concern  . None   Social History Narrative  . None   Additional Social History:    Pain Medications: Reports abuse of Rx opiates occasionally Prescriptions: Reports ativan daily Over the Counter: none History of alcohol / drug use?: Yes Longest period of sobriety (when/how long): 10 years currently Negative Consequences of Use: Financial Withdrawal Symptoms: Agitation, Cramps, Tachycardia Name of Substance 1: Methamphetamine 1 - Age of First Use: 37 1 - Amount (size/oz): unknown-varies 1 - Frequency: daily 1 - Duration: years 1 - Last Use / Amount: last Saturday-2 days ago Name of Substance 2: Heroine 2 - Age of First Use: 40 2 - Amount (size/oz): unknown 2 - Frequency: tried 1st time last Saturday 2 - Last Use / Amount: 2 days ago Name of Substance 3: Cocaine 3 - Age of First Use: 67s 3 - Amount (size/oz): unknown 3 - Frequency: 3-4 times a week 3 - Duration: years 3 - Last Use / Amount: 2 days ago Name of Substance 4: Nicotine/Cigarettes 4 - Age of First Use: 14 4 - Amount (size/oz): 1 pack per day 4 - Duration: ongoing 4 - Last Use / Amount: 10/04/15   Name of Substance 6: Suboxone 6 - Age of First Use: 32 6 - Amount (size/oz): unknown 6 -  Frequency: daily 6 - Duration: 3  6 - Last Use / Amount: stopped 4-5 yrs ago  Sleep: Poor  Appetite:  Good  Current Medications: Current Facility-Administered Medications  Medication Dose Route Frequency Provider Last Rate Last Dose  . acetaminophen (TYLENOL) tablet 650 mg  650 mg Oral Q6H PRN Kerrie Buffalo, NP      . alum & mag hydroxide-simeth (MAALOX/MYLANTA) 200-200-20 MG/5ML suspension 30 mL  30 mL Oral Q4H PRN Kerrie Buffalo, NP   30 mL at 10/07/15 1425  . clonazePAM (KLONOPIN) tablet 0.25 mg  0.25 mg Oral BID Norman Clay, MD   0.25 mg at 10/08/15 0751  . cloNIDine  (CATAPRES) tablet 0.1 mg  0.1 mg Oral BH-qamhs Norman Clay, MD   0.1 mg at 10/08/15 0752   Followed by  . [START ON 10/09/2015] cloNIDine (CATAPRES) tablet 0.1 mg  0.1 mg Oral QAC breakfast Norman Clay, MD      . dicyclomine (BENTYL) tablet 20 mg  20 mg Oral Q6H PRN Norman Clay, MD      . famotidine (PEPCID) tablet 10 mg  10 mg Oral BID Norman Clay, MD   10 mg at 10/08/15 0755  . feeding supplement (ENSURE ENLIVE) (ENSURE ENLIVE) liquid 237 mL  237 mL Oral BID BM Norman Clay, MD   237 mL at 10/08/15 1430  . gabapentin (NEURONTIN) capsule 500 mg  500 mg Oral TID Norman Clay, MD   500 mg at 10/08/15 1153  . hydrOXYzine (ATARAX/VISTARIL) tablet 25 mg  25 mg Oral Q6H PRN Norman Clay, MD   25 mg at 10/08/15 1456  . loperamide (IMODIUM) capsule 2-4 mg  2-4 mg Oral PRN Norman Clay, MD      . magnesium hydroxide (MILK OF MAGNESIA) suspension 30 mL  30 mL Oral Daily PRN Kerrie Buffalo, NP      . methocarbamol (ROBAXIN) tablet 500 mg  500 mg Oral Q8H PRN Norman Clay, MD      . naproxen (NAPROSYN) tablet 500 mg  500 mg Oral BID PRN Norman Clay, MD   500 mg at 10/08/15 0758  . nicotine (NICODERM CQ - dosed in mg/24 hours) patch 21 mg  21 mg Transdermal Daily Norman Clay, MD   21 mg at 10/08/15 0751  . ondansetron (ZOFRAN-ODT) disintegrating tablet 4 mg  4 mg Oral Q6H PRN Norman Clay, MD      . sertraline (ZOLOFT) tablet 50 mg  50 mg Oral Daily Norman Clay, MD   50 mg at 10/08/15 0753  . traZODone (DESYREL) tablet 50 mg  50 mg Oral QHS Norman Clay, MD   50 mg at 10/07/15 2115   Lab Results:  Results for orders placed or performed during the hospital encounter of 10/05/15 (from the past 48 hour(s))  Basic metabolic panel     Status: Abnormal   Collection Time: 10/07/15  6:26 PM  Result Value Ref Range   Sodium 140 135 - 145 mmol/L   Potassium 4.7 3.5 - 5.1 mmol/L   Chloride 105 101 - 111 mmol/L   CO2 29 22 - 32 mmol/L   Glucose, Bld 129 (H) 65 - 99 mg/dL   BUN 18 6 - 20 mg/dL   Creatinine,  Ser 1.00 0.61 - 1.24 mg/dL   Calcium 9.1 8.9 - 10.3 mg/dL   GFR calc non Af Amer >60 >60 mL/min   GFR calc Af Amer >60 >60 mL/min    Comment: (NOTE) The eGFR has been calculated using the CKD EPI equation. This  calculation has not been validated in all clinical situations. eGFR's persistently <60 mL/min signify possible Chronic Kidney Disease.    Anion gap 6 5 - 15    Comment: Performed at [facility]   Blood Alcohol level:  Lab Results  Component Value Date   [facility] <5 10/04/2015   ETH <5 68/01/7516   Metabolic Disorder Labs: No results found for: HGBA1C, MPG No results found for: PROLACTIN No results found for: CHOL, TRIG, HDL, CHOLHDL, VLDL, LDLCALC  Physical Findings: AIMS: Facial and Oral Movements Muscles of Facial Expression: None, normal Lips and Perioral Area: None, normal Jaw: None, normal Tongue: None, normal,Extremity Movements Upper (arms, wrists, hands, fingers): None, normal Lower (legs, knees, ankles, toes): None, normal, Trunk Movements Neck, shoulders, hips: None, normal, Overall Severity Severity of abnormal movements (highest score from questions above): None, normal Incapacitation due to abnormal movements: None, normal Patient's awareness of abnormal movements (rate only patient's report): No Awareness, Dental Status Current problems with teeth and/or dentures?: No Does patient usually wear dentures?: No  CIWA:    COWS:  COWS Total Score: 4  Musculoskeletal: Strength & Muscle Tone: within normal limits Gait & Station: normal Patient leans: N/A  Psychiatric Specialty Exam: Physical Exam  Constitutional: He appears well-developed.  HENT:  Head: Normocephalic.  Eyes: Pupils are equal, round, and reactive to light.  Neck: Normal range of motion.  Cardiovascular: Normal rate.   Respiratory: Effort normal. Rales: .  GI: Soft.    Review of Systems  Cardiovascular: Positive for palpitations. Negative for chest pain.   Gastrointestinal: Positive for abdominal pain and nausea. Negative for diarrhea.  Musculoskeletal: Positive for joint pain and myalgias.  Neurological: Positive for tremors.  Psychiatric/Behavioral: Positive for depression and suicidal ideas. The patient is nervous/anxious and has insomnia.     Blood pressure 112/72, pulse 92, temperature 97.8 F (36.6 C), temperature source Oral, resp. rate 16, height '5\' 9"'  (1.753 m), weight 71.6 kg (157 lb 12 oz).Body mass index is 23.3 kg/m.  General Appearance: Casual  Eye Contact:  Fair  Speech:  Clear and Coherent, Spontaneous, pressured  Volume:  Normal  Mood:  Anxious, agitated  Affect:  Full Range- improving  Thought Process:  Coherent and Goal Directed  Orientation:  Full (Time, Place, and Person)  Thought Content:  Logical Perceptions: denies AH/VH  Suicidal Thoughts:  Yes.  without intent/plan  Homicidal Thoughts:  No  Memory:  intact to recent and remote history  Judgement:  Fair  Insight:  Fair  Psychomotor Activity:  Normal  Concentration:  Concentration: Fair and Attention Span: Fair  Recall:  [employer] of Knowledge:  Fair  Language:  Fair  Akathisia:  No  Handed:  Right  AIMS (if indicated):     Assets:  Communication Skills Desire for Improvement  ADL's:  Intact  Cognition:  WNL  Sleep:  Number of Hours: 6.75   Assessment Mr. Treinen is a 40 year old with polysubstance use disorder (methamphetamine, Suboxone), self reported history of seizure, RA, SLE who presented for detox. Family hx of homicide, Schizophrenia/Bipolar affective disorder.  # Methamphetamine use disorder # Opioid use disorder Patient is motivated for sobriety. He endorses physical symptoms of withdrawal as described above. Will continue clonidine protocol.   # MDD # r/o Substance induced mood disorder # r/o Unspecified anxiety disorder There has been an improvement in his neurovegetative symptoms but he continues to endorse anxiety. Will increase  gabapentin to target both anxiety and pain. Will plan to  slowly uptitrate sertraline. Will continue clonazepam at this time; which was switched from Ativan to avoid risk of dependence.   Plans - Continue sertraline 50 mg daily - Continue clonidine protocol - Increase gabapentin to 600 mg QID for agitation/substance withdrawal syndrome. - Initiate Seroquel 100 mg Q bedtime for mood control. - Continue clonazepam 0.25 mg BID - Trazodone 50 mg qhs for insomnia - TSH reviewed; 1.007 - - Admit for crisis management and stabilization. - Medication management to reduce current symptoms to base line and improve the patient's overall level of functioning. - Monitor for the adverse effect of the medications and anger outbursts - Continue 15 minutes observation for safety concerns - Encouraged to participate in milieu therapy and group therapy counseling sessions and also work with coping skills -  Develop treatment plan to decrease risk of relapse upon discharge and to reduce the need for readmission. -  Psycho-social education regarding relapse prevention and self care. - Health care follow up as needed for medical problems. - Restart home medications where appropriate.  Treatment Plan Summary: Daily contact with patient to assess and evaluate symptoms and progress in treatment  Encarnacion Slates, NP, PMHNP, FNP-BC 10/08/2015, 3:14 PM

## 2015-10-08 NOTE — [facility] Group Notes (Signed)
[name] LCSW Group Therapy  10/08/2015 10:00 AM  Type of Therapy:  Group Therapy  Participation Level:  Active  Participation Quality:  Monopolizing and Redirectable  Affect:  Excited  Cognitive:  Alert and Oriented  Insight:  Lacking  Engagement in Therapy:  Monopolizing  Modes of Intervention:  Discussion and Education  Summary of Progress/Problems: Group topic was about Managing Change. We discussed the cycle of change from Pre-contemplation to Relapse. Each participant was encouraged to identify their own changes, where they are in the change process and what detailed Preparation/Plan they have to accomplish the change. Reviewed with patients thought/actions based on triggers in order to assist in the development of a plan. Patient was willing to share during group, but it was unclear how much he heard. Patient was often tangential, monopolizing and had some difficulty understanding attempts at redirection. However, patient remained pleasant and was not inappropriate.   Beverly Sessions 10/08/2015, 5:46 PM

## 2015-10-08 NOTE — Progress Notes (Signed)
Data. Patient denies SI/HI/AVH. Patient is able to verball contract for safety on the unit and to come to satff if his thoughts/feelings become overwhelming and prior to any actimng on them. Patient was singing an inappropriate sing, loudly in the hall with a peer this PM. He was easily redirected, however. Patient interacting well with staff and other patients. On his self assessment patient reports 5/10 for depression and hopelessness and 7/10 for anxiety. He has not goal for today.  Action. Emotional support and encouragement offered. Education provided on medication, indications and side effect. Q 15 minute checks done for safety. Response. Safety on the unit maintained through 15 minute checks.  Medications taken as prescribed. Attended groups. Remained calm and appropriate through out most of shift.

## 2015-10-09 DIAGNOSIS — F332 Major depressive disorder, recurrent severe without psychotic features: Secondary | ICD-10-CM

## 2015-10-09 MED ORDER — CLONAZEPAM 0.5 MG PO TABS
0.2500 mg | ORAL_TABLET | Freq: Three times a day (TID) | ORAL | Status: DC
  Administered 2015-10-09 – 2015-10-10 (×3): 0.25 mg via ORAL
  Filled 2015-10-09 (×3): qty 1

## 2015-10-09 NOTE — [facility] Group Notes (Signed)
[name] LCSW Group Therapy  10/09/2015 10:00 AM  Type of Therapy:  Group Therapy  Participation Level:  Active  Participation Quality:  Attentive, Monopolizing and Redirectable  Affect:  Appropriate  Cognitive:  Alert and Oriented  Insight:  Limited  Engagement in Therapy:  Engaged  Modes of Intervention:  Discussion  Summary of Progress/Problems:  Group today focused on processing 5 P's. Passion, Power, Problem, Plan and Partners. This exercise encourages participants to work through plan for things they desire to do and pulling together the resources and supports to help them work through it. Participants will have the opportunity to develop these ideas as they leave. Key therapeutic elements include goal setting, developing coping skills and expanding supports. Patient identified that he understood group concepts but continues to refocus on his passion for using which makes his desire for recovery questionable.   Beverly Sessions 10/09/2015, 12:30 PM

## 2015-10-09 NOTE — Progress Notes (Signed)
D: Patient seen interacting with peers and watching TV on day room. Denies pain, SI, AH/VH at this time. Patient expresses happiness over his mom visiting. He stated "My mamma made my day". Patient stated that his day was fabulous. Rated depression and anxiety 0/10 and 2/10 respectively. No behavioral issues noted.  A: Staff encouraged patient to continue and participate with the treatment plan and verbalize needs to staff. Due meds given as ordered. Every 15 minutes check for safety maintained. Will continue to monitor patient for safety.  R: Patient receptive treatment.

## 2015-10-09 NOTE — [facility] Group Notes (Signed)
Nursing psychoeducational group focussed on mindfulness meditation/body scanning technique with guided meditation.   Pt briefly attended group but left shortly and quietly  after the meditation started.

## 2015-10-09 NOTE — Progress Notes (Signed)
Patient ID: Kenneth Elliott, male   DOB: 1975-05-09, 40 y.o.   MRN: [id number] Patient ID: Kenneth Elliott, male   DOB: 04/29/1975, 40 y.o.   MRN: [id number] Patient ID: Kenneth Elliott, male   DOB: 1976/01/20, 40 y.o.   MRN: [id number] [facility] MD Progress Note  10/09/2015 2:22 PM Kenneth Elliott  MRN:  [id number]  Subjective: Kenneth Elliott, "I"m feeling a lot better. Slept better last night. The Seroquel helped a lot. The anxiety continues during the day time to late evenings. Clonazepam 0.25 mg bid not helping much. I'm worried as my parents that I was suppose to go home to after discharge are leaving to go to the beach tonight".  Objective: Patient seen, chart reviewed and case discussed with nursing staff. He states that he has been feeling anxious during the late evenings. He has worsening anxiety when he is by himself. He states that it has been a problem for him for many years. Last attack occurred last week. He denies myalgia. He is endorsing distressing thoughts of not having his parents as he his safe heaven when he gets discharged because they are leaving to the beach tonight. He says he slept better last night. He is hoping to go to residential treatment ([facility] Will continue the Seroquel & adjusted dose of Gabapentin. See MAR.  Principal Problem: Polysubstance dependence ([place]) Diagnosis:   Patient Active Problem List   Diagnosis Date Noted  . MDD (major depressive disorder) ([place]) [F32.9] 10/05/2015  . Polysubstance dependence ([place]) [F19.20] 10/05/2015   Total Time spent with patient: 15 minutes  Past Psychiatric History: see HPI  Past Medical History:  Past Medical History:  Diagnosis Date  . Hypertension   . Lupus ([place])   . Rheumatoid arthritis ([place])   . Seizures ([place])    per patient   History reviewed. No pertinent surgical history.  Family History: History reviewed. No pertinent family history.  Family Psychiatric  History: Schizophrenia, completed  suicide, Bipolar disorder & polysubstance dependence,  Social History:  History  Alcohol Use  . Yes     History  Drug use: Unknown    Social History   Social History  . Marital status: Divorced    Spouse name: N/A  . Number of children: N/A  . Years of education: N/A   Social History Main Topics  . Smoking status: Current Every Day Smoker    Packs/day: 1.00    Types: Cigarettes  . Smokeless tobacco: Current User  . Alcohol use Yes  . Drug use: Unknown  . Sexual activity: Not Currently   Other Topics Concern  . None   Social History Narrative  . None   Additional Social History:    Pain Medications: Elliott abuse of Rx opiates occasionally Prescriptions: Elliott ativan daily Over the Counter: none History of alcohol / drug use?: Yes Longest period of sobriety (when/how long): 10 years currently Negative Consequences of Use: Financial Withdrawal Symptoms: Agitation, Cramps, Tachycardia Name of Substance 1: Methamphetamine 1 - Age of First Use: 37 1 - Amount (size/oz): unknown-varies 1 - Frequency: daily 1 - Duration: years 1 - Last Use / Amount: last Saturday-2 days ago Name of Substance 2: Heroine 2 - Age of First Use: 40 2 - Amount (size/oz): unknown 2 - Frequency: tried 1st time last Saturday 2 - Last Use / Amount: 2 days ago Name of Substance 3: Cocaine 3 - Age of First Use: 39s 3 - Amount (size/oz): unknown 3 - Frequency:  3-4 times a week 3 - Duration: years 3 - Last Use / Amount: 2 days ago Name of Substance 4: Nicotine/Cigarettes 4 - Age of First Use: 14 4 - Amount (size/oz): 1 pack per day 4 - Duration: ongoing 4 - Last Use / Amount: 10/04/15   Name of Substance 6: Suboxone 6 - Age of First Use: 32 6 - Amount (size/oz): unknown 6 - Frequency: daily 6 - Duration: 3  6 - Last Use / Amount: stopped 4-5 yrs ago  Sleep: Poor  Appetite:  Good  Current Medications: Current Facility-Administered Medications  Medication Dose Route Frequency  Provider Last Rate Last Dose  . acetaminophen (TYLENOL) tablet 650 mg  650 mg Oral Q6H PRN Kerrie Buffalo, NP      . alum & mag hydroxide-simeth (MAALOX/MYLANTA) 200-200-20 MG/5ML suspension 30 mL  30 mL Oral Q4H PRN Kerrie Buffalo, NP   30 mL at 10/07/15 1425  . clonazePAM (KLONOPIN) tablet 0.25 mg  0.25 mg Oral TID PC Encarnacion Slates, NP   0.25 mg at 10/09/15 1201  . cloNIDine (CATAPRES) tablet 0.1 mg  0.1 mg Oral QAC breakfast Norman Clay, MD   0.1 mg at 10/09/15 0818  . dicyclomine (BENTYL) tablet 20 mg  20 mg Oral Q6H PRN Norman Clay, MD   20 mg at 10/09/15 0827  . famotidine (PEPCID) tablet 10 mg  10 mg Oral BID Norman Clay, MD   10 mg at 10/09/15 0818  . feeding supplement (ENSURE ENLIVE) (ENSURE ENLIVE) liquid 237 mL  237 mL Oral BID BM Norman Clay, MD   237 mL at 10/09/15 0824  . gabapentin (NEURONTIN) capsule 600 mg  600 mg Oral TID PC & HS Encarnacion Slates, NP   600 mg at 10/09/15 1201  . hydrOXYzine (ATARAX/VISTARIL) tablet 25 mg  25 mg Oral Q6H PRN Norman Clay, MD   25 mg at 10/08/15 2145  . loperamide (IMODIUM) capsule 2-4 mg  2-4 mg Oral PRN Norman Clay, MD      . magnesium hydroxide (MILK OF MAGNESIA) suspension 30 mL  30 mL Oral Daily PRN Kerrie Buffalo, NP      . methocarbamol (ROBAXIN) tablet 500 mg  500 mg Oral Q8H PRN Norman Clay, MD      . naproxen (NAPROSYN) tablet 500 mg  500 mg Oral BID PRN Norman Clay, MD   500 mg at 10/08/15 0758  . nicotine (NICODERM CQ - dosed in mg/24 hours) patch 21 mg  21 mg Transdermal Daily Norman Clay, MD   21 mg at 10/09/15 0819  . ondansetron (ZOFRAN-ODT) disintegrating tablet 4 mg  4 mg Oral Q6H PRN Norman Clay, MD      . QUEtiapine (SEROQUEL) tablet 100 mg  100 mg Oral QHS Encarnacion Slates, NP   100 mg at 10/08/15 2142  . sertraline (ZOLOFT) tablet 50 mg  50 mg Oral Daily Norman Clay, MD   50 mg at 10/09/15 0817  . traZODone (DESYREL) tablet 50 mg  50 mg Oral QHS Norman Clay, MD   50 mg at 10/08/15 2143   Lab Results:  Results for  orders placed or performed during the hospital encounter of 10/05/15 (from the past 48 hour(s))  Basic metabolic panel     Status: Abnormal   Collection Time: 10/07/15  6:26 PM  Result Value Ref Range   Sodium 140 135 - 145 mmol/L   Potassium 4.7 3.5 - 5.1 mmol/L   Chloride 105 101 - 111 mmol/L   CO2  29 22 - 32 mmol/L   Glucose, Bld 129 (H) 65 - 99 mg/dL   BUN 18 6 - 20 mg/dL   Creatinine, Ser 1.00 0.61 - 1.24 mg/dL   Calcium 9.1 8.9 - 10.3 mg/dL   GFR calc non Af Amer >60 >60 mL/min   GFR calc Af Amer >60 >60 mL/min    Comment: (NOTE) The eGFR has been calculated using the CKD EPI equation. This calculation has not been validated in all clinical situations. eGFR's persistently <60 mL/min signify possible Chronic Kidney Disease.    Anion gap 6 5 - 15    Comment: Performed at [facility]   Blood Alcohol level:  Lab Results  Component Value Date   [facility] <5 10/04/2015   ETH <5 38/75/6433   Metabolic Disorder Labs: No results found for: HGBA1C, MPG No results found for: PROLACTIN No results found for: CHOL, TRIG, HDL, CHOLHDL, VLDL, LDLCALC  Physical Findings: AIMS: Facial and Oral Movements Muscles of Facial Expression: None, normal Elliott and Perioral Area: None, normal Jaw: None, normal Tongue: None, normal,Extremity Movements Upper (arms, wrists, hands, fingers): None, normal Lower (legs, knees, ankles, toes): None, normal, Trunk Movements Neck, shoulders, hips: None, normal, Overall Severity Severity of abnormal movements (highest score from questions above): None, normal Incapacitation due to abnormal movements: None, normal Patient's awareness of abnormal movements (rate only patient's report): No Awareness, Dental Status Current problems with teeth and/or dentures?: No Does patient usually wear dentures?: No  CIWA:    COWS:  COWS Total Score: 5  Musculoskeletal: Strength & Muscle Tone: within normal limits Gait & Station: normal Patient leans:  N/A  Psychiatric Specialty Exam: Physical Exam  Constitutional: He appears well-developed.  HENT:  Head: Normocephalic.  Eyes: Pupils are equal, round, and reactive to light.  Neck: Normal range of motion.  Cardiovascular: Normal rate.   Respiratory: Effort normal. Rales: .  GI: Soft.    Review of Systems  Cardiovascular: Positive for palpitations. Negative for chest pain.  Gastrointestinal: Positive for abdominal pain and nausea. Negative for diarrhea.  Musculoskeletal: Positive for joint pain and myalgias.  Neurological: Positive for tremors.  Psychiatric/Behavioral: Positive for depression and suicidal ideas. The patient is nervous/anxious and has insomnia.     Blood pressure 112/70, pulse (!) 102, temperature 97.8 F (36.6 C), temperature source Oral, resp. rate 16, height '5\' 9"'  (1.753 m), weight 71.6 kg (157 lb 12 oz).Body mass index is 23.3 kg/m.  General Appearance: Casual  Eye Contact:  Fair  Speech:  Clear and Coherent, Spontaneous, pressured  Volume:  Normal  Mood:  Anxious, agitated  Affect:  Full Range- improving  Thought Process:  Coherent and Goal Directed  Orientation:  Full (Time, Place, and Person)  Thought Content:  Logical Perceptions: denies AH/VH  Suicidal Thoughts:  Yes.  without intent/plan  Homicidal Thoughts:  No  Memory:  intact to recent and remote history  Judgement:  Fair  Insight:  Fair  Psychomotor Activity:  Normal  Concentration:  Concentration: Fair and Attention Span: Fair  Recall:  [employer] of Knowledge:  Fair  Language:  Fair  Akathisia:  No  Handed:  Right  AIMS (if indicated):     Assets:  Communication Skills Desire for Improvement  ADL's:  Intact  Cognition:  WNL  Sleep:  Number of Hours: 6.25   Assessment Kenneth Elliott is a 40 year old with polysubstance use disorder (methamphetamine, Suboxone), self reported history of seizure, RA, SLE who presented for detox. Family  hx of homicide, Schizophrenia/Bipolar affective  disorder.  # Methamphetamine use disorder # Opioid use disorder Patient is motivated for sobriety. He endorses physical symptoms of withdrawal as described above. Will continue clonidine protocol.   # MDD # r/o Substance induced mood disorder # r/o Unspecified anxiety disorder There has been an improvement in his neurovegetative symptoms but he continues to endorse anxiety. Will increase gabapentin to target both anxiety and pain. Will plan to slowly uptitrate sertraline. Will continue clonazepam at this time; which was switched from Ativan to avoid risk of dependence.   Plans - Continue sertraline 50 mg daily - Continue clonidine protocol - Continue gabapentin to 600 mg QID for agitation/substance withdrawal syndrome. - Continue Seroquel 100 mg Q bedtime for mood control. - Increased clonazepam 0.25 mg to TID for severe anxiety. - Trazodone 50 mg qhs for insomnia - TSH reviewed; 1.007 - - Admit for crisis management and stabilization. - Medication management to reduce current symptoms to base line and improve the patient's overall level of functioning. - Monitor for the adverse effect of the medications and anger outbursts - Continue 15 minutes observation for safety concerns - Encouraged to participate in milieu therapy and group therapy counseling sessions and also work with coping skills -  Develop treatment plan to decrease risk of relapse upon discharge and to reduce the need for readmission. -  Psycho-social education regarding relapse prevention and self care. - Health care follow up as needed for medical problems. - Restart home medications where appropriate.  Treatment Plan Summary: Daily contact with patient to assess and evaluate symptoms and progress in treatment  Encarnacion Slates, NP, PMHNP, FNP-BC 10/09/2015, 2:22 PM

## 2015-10-09 NOTE — Progress Notes (Signed)
D: Pt presents anxious on approach. Pt rates depression 5/10. Anxiety 7/10. Hopeless 4/10. Pt reports withdrawal symptoms of cramps (bentyl given) and agitation. Pt denies suicidal thoughts. Pt seeking long term tx at [facility] treatment facility. Pt compliant with tx.  A: Medications reviewed with pt. Medications administered as ordered per MD. Verbal support provided. Pt encouraged to attend groups. 15 minute checks performed for safety.  R: Pt receptive to tx.

## 2015-10-10 DIAGNOSIS — F332 Major depressive disorder, recurrent severe without psychotic features: Principal | ICD-10-CM

## 2015-10-10 DIAGNOSIS — F1920 Other psychoactive substance dependence, uncomplicated: Secondary | ICD-10-CM

## 2015-10-10 MED ORDER — CLONAZEPAM 0.5 MG PO TABS: 0.2500 mg | ORAL_TABLET | Freq: Three times a day (TID) | ORAL | 0 refills | Status: DC

## 2015-10-10 MED ORDER — GABAPENTIN 600 MG PO TABS
600.0000 mg | ORAL_TABLET | Freq: Four times a day (QID) | ORAL | Status: DC
  Filled 2015-10-10 (×6): qty 28

## 2015-10-10 MED ORDER — GABAPENTIN 300 MG PO CAPS: 600.0000 mg | ORAL_CAPSULE | Freq: Three times a day (TID) | ORAL | 0 refills | Status: DC

## 2015-10-10 MED ORDER — QUETIAPINE FUMARATE 100 MG PO TABS: 100.0000 mg | ORAL_TABLET | Freq: Every day | ORAL | 0 refills | Status: DC

## 2015-10-10 MED ORDER — TRAZODONE HCL 50 MG PO TABS: 50.0000 mg | ORAL_TABLET | Freq: Every day | ORAL | 0 refills | Status: DC

## 2015-10-10 MED ORDER — NICOTINE 21 MG/24HR TD PT24: 21.0000 mg | MEDICATED_PATCH | Freq: Every day | TRANSDERMAL | 0 refills | Status: DC

## 2015-10-10 MED ORDER — FAMOTIDINE 10 MG PO TABS: 10.0000 mg | ORAL_TABLET | Freq: Two times a day (BID) | ORAL | 0 refills | Status: DC

## 2015-10-10 MED ORDER — SERTRALINE HCL 50 MG PO TABS: 50.0000 mg | ORAL_TABLET | Freq: Every day | ORAL | 0 refills | Status: DC

## 2015-10-10 MED ORDER — HYDROXYZINE HCL 25 MG PO TABS: 25.0000 mg | ORAL_TABLET | Freq: Four times a day (QID) | ORAL | 0 refills | Status: DC | PRN

## 2015-10-10 NOTE — Progress Notes (Signed)
Patient did attend the evening speaker AA meeting.  

## 2015-10-10 NOTE — Discharge Summary (Signed)
Physician Discharge Summary Note  Patient:  Kenneth Elliott is an 40 y.o., male MRN:  [id number] DOB:  Aug 04, 1975 Patient phone:  [phone] (home)  Patient address:   [address] [place] [address],  Total Time spent with patient: Greater than 30 minutes  Date of Admission:  10/05/2015  Date of Discharge: 10-10-15  Reason for Admission:  Opioid withdrawal symptoms.   Principal Problem: Polysubstance dependence [facility])  Discharge Diagnoses: Patient Active Problem List   Diagnosis Date Noted  . 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 MDD (major depressive disorder) ([facility]) [F32.9] 10/05/2015  . Polysubstance dependence ([facility]) [F19.20] 10/05/2015   Past Psychiatric History: Polysubstance dependence  Past Medical History:  Past Medical History:  Diagnosis Date  . Hypertension   . Lupus ([facility])   . Rheumatoid arthritis ([facility])   . Seizures ([facility])    per patient   History reviewed. No pertinent surgical history. Family History: History reviewed. No pertinent family history. Family Psychiatric  History: See H&P. Social History:  History  Alcohol Use  . Yes     History  Drug use: Unknown    Social History   Social History  . Marital status: Divorced    Spouse name: N/A  . Number of children: N/A  . Years of education: N/A   Social History Main Topics  . Smoking status: Current Every Day Smoker    Packs/day: 1.00    Types: Cigarettes  . Smokeless tobacco: Current User  . Alcohol use Yes  . Drug use: Unknown  . Sexual activity: Not Currently   Other Topics Concern  . None   Social History Narrative  . None   Hospital Course: Kenneth Elliott is a 40 year old with self reported history of seizure, RA, SLE who presented for detox. He states that he came from [place] to [place] a few days ago. He feels "tired of (emotionally) hurting my wife" and came to [facility] for detox treatment. He chose [facility] as he has family  support. He states he has never tried any treatment before. 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 He is hoping to get sober and gets back together with her. He states he has been using Suboxone (uses one fourth pill) mixed with methamphetamine. He uses cocaine last Saturday after a long time but denies regular use. He used heroin last Saturday for the first time. He states that he uses it to "escape reality." He admits using $250,000 for buying drugs in his life time.  After evaluation of his presenting symptoms, [name] received Clonidine detox protocols for opioid withdrawal symptoms. He also received other medication regimen for mood control/stabilization. They includes; Seroquel 150 mg for mood control, Hydroxyzine 25 mg for anxiety, Gabapentin 600 mg for agitation/substance withdrawal symptoms, Sertraline 50 mg for depression & Trazodone 50 mg for insomnia.  Besides the detoxification/mood stabilization treatments, [name] was also enrolled & participated in the group counseling sessions being offered & held on this unit. He learned coping skills. Part of his discharge plans is a referral to a long term substance abuse treatment center for further substance abuse treatment.  Kenneth Elliott has completed detox treatment & his mood is stable. He is currently mentally & medically stable to discharged to continue further substance abuse treatment as noted below on 10-12-15. Kenneth Elliott says he will be residing with his parents here in [place] until he is ready  to go to the [employer] [facility] [place] in, [place]. Upon discharge, he adamantly denies any SIHI, AVH, delusional thoughts or paranoia. He was provided with a 14 days worth, supply samples of his [facility] discharge medications. He left [facility] with all personal belongings in no apparent distress. Transportation per his arrangement.   Physical Findings: AIMS: Facial and Oral Movements Muscles of Facial Expression:  None, normal Lips and Perioral Area: None, normal Jaw: None, normal Tongue: None, normal,Extremity Movements Upper (arms, wrists, hands, fingers): None, normal Lower (legs, knees, ankles, toes): None, normal, Trunk Movements Neck, shoulders, hips: None, normal, Overall Severity Severity of abnormal movements (highest score from questions above): None, normal Incapacitation due to abnormal movements: None, normal Patient's awareness of abnormal movements (rate only patient's report): No Awareness, Dental Status Current problems with teeth and/or dentures?: No Does patient usually wear dentures?: No  CIWA:    COWS:  COWS Total Score: 0  Musculoskeletal: Strength & Muscle Tone: within normal limits Gait & Station: normal Patient leans: N/A  Psychiatric Specialty Exam: Physical Exam  Constitutional: He appears well-developed.  HENT:  Head: Normocephalic.  Eyes: Pupils are equal, round, and reactive to light.  Neck: Normal range of motion.  Cardiovascular: Normal rate.   Respiratory: Effort normal.  GI: Soft.  Genitourinary:  Genitourinary Comments: Denies any issues in this area  Musculoskeletal: Normal range of motion.  Neurological: He is alert.  Skin: Skin is warm.    Review of Systems  Constitutional: Negative.   HENT: Negative.   Eyes: Negative.   Respiratory: Negative.   Cardiovascular: Negative.   Gastrointestinal: Negative.   Genitourinary: Negative.   Musculoskeletal: Negative.   Skin: Negative.   Neurological: Negative.   Endo/Heme/Allergies: Negative.   Psychiatric/Behavioral: Positive for depression (Stable) and substance abuse (Hx. Polysubstance dependence). Negative for hallucinations, memory loss and suicidal ideas. The patient has insomnia (Stable). The patient is not nervous/anxious.     Blood pressure 121/72, pulse (!) 103, temperature 98.2 F (36.8 C), temperature source Oral, resp. rate 16, height 5\' 9"  (1.753 m), weight 71.6 kg (157 lb 12 oz).Body  mass index is 23.3 kg/m.  See Md's SRA   Have you used any form of tobacco in the last 30 days? (Cigarettes, Smokeless Tobacco, Cigars, and/or Pipes): Yes  Has this patient used any form of tobacco in the last 30 days? (Cigarettes, Smokeless Tobacco, Cigars, and/or Pipes): Yes, provided with Nicotine patch prescription.  Blood Alcohol level:  Lab Results  Component Value Date   ETH <5 10/04/2015   ETH <5 09/28/2015   Metabolic Disorder Labs:  No results found for: HGBA1C, MPG No results found for: PROLACTIN No results found for: CHOL, TRIG, HDL, CHOLHDL, VLDL, LDLCALC  See Psychiatric Specialty Exam and Suicide Risk Assessment completed by Attending Physician prior to discharge.  Discharge destination:  Home  Is patient on multiple antipsychotic therapies at discharge:  No   Has Patient had three or more failed trials of antipsychotic monotherapy by history:  No  Recommended Plan for Multiple Antipsychotic Therapies: NA    Medication List    STOP taking these medications   LORazepam 1 MG tablet Commonly known as:  ATIVAN     TAKE these medications     Indication  clonazePAM 0.5 MG tablet Commonly known as:  KLONOPIN Take 0.5 tablets (0.25 mg total) by mouth 3 (three) times daily after meals. For anxiety  Indication:  Severe anxiety   famotidine 10 MG tablet Commonly known as:  PEPCID Take  1 tablet (10 mg total) by mouth 2 (two) times daily. For acid reflux  Indication:  Gastroesophageal Reflux Disease   gabapentin 300 MG capsule Commonly known as:  NEURONTIN Take 2 capsules (600 mg total) by mouth 4 (four) times daily - after meals and at bedtime. For agitation  Indication:  Agitation   hydrOXYzine 25 MG tablet Commonly known as:  ATARAX/VISTARIL Take 1 tablet (25 mg total) by mouth every 6 (six) hours as needed for anxiety.  Indication:  Anxiety   nicotine 21 mg/24hr patch Commonly known as:  NICODERM CQ - dosed in mg/24 hours Place 1 patch (21 mg total)  onto the skin daily. For nicotine dependence  Indication:  Nicotine Addiction   QUEtiapine 100 MG tablet Commonly known as:  SEROQUEL Take 1 tablet (100 mg total) by mouth at bedtime. For mood control  Indication:  Mood control   sertraline 50 MG tablet Commonly known as:  ZOLOFT Take 1 tablet (50 mg total) by mouth daily. For depression  Indication:  Major Depressive Disorder   traZODone 50 MG tablet Commonly known as:  DESYREL Take 1 tablet (50 mg total) by mouth at bedtime. For sleep  Indication:  Trouble Sleeping      Follow-up Information    [facility] Follow up on 10/07/2015.   Why:  Assessment for possible admission on Thursday Sept. 7th at 7:45am. Bring your [place] ID and 30 day supply of medications. Call office if you need to reschedule.  Contact information: [address] [place] [address] [phone]          Follow-up recommendations: Activity:  As tolerated Diet: As recommended by your primary care doctor. Keep all scheduled follow-up appointments as recommended.   Comments: Patient is instructed prior to discharge to: Take all medications as prescribed by his/her mental healthcare provider. Report any adverse effects and or reactions from the medicines to his/her outpatient provider promptly. Patient has been instructed & cautioned: To not engage in alcohol and or illegal drug use while on prescription medicines. In the event of worsening symptoms, patient is instructed to call the crisis hotline, 911 and or go to the nearest ED for appropriate evaluation and treatment of symptoms. To follow-up with his/her primary care provider for your other medical issues, concerns and or health care needs.   Signed: Sanjuana Kava, NP, PMHNP, FNP-BC 10/10/2015, 9:20 AM

## 2015-10-10 NOTE — Progress Notes (Signed)
Kenneth Elliott had been up and visible in milieu this evening, did attend and participate in evening group activity. He endorsed on-going anxiety and spoke about impending discharge soon and spoke about how he is hopeful to get a bed at [facility] to continue his treatment but also spoke about being prepared to discharge to his parents if that was not possible. Nadir received all bedtime medications without incident and did not verbalize any complaints of pain. A. Support and encouragement provided. R. Safety maintained, will continue to monitor.

## 2015-10-10 NOTE — Progress Notes (Signed)
Patient discharged per physician order; patient denies SI/HI and A/V hallucinations; patient received prescriptions, samples, AVS, copy of the suicide safety plan, suicide risk assessment note, and transition record given to the patient after it was reviewed; patient had no other questions or concerns at this time; patient verbalized and signed that all belongings were returned; patient left the unit ambulatory 

## 2015-10-10 NOTE — Progress Notes (Signed)
  [facility] Adult Case Management Discharge Plan :  Will you be returning to the same living situation after discharge:  No. Patient reports that he can stay with family at discharge At discharge, do you have transportation home?: Yes,  patient reports access to transportation Do you have the ability to pay for your medications: Yes,  patient will be provided with precriptions at discharge  Release of information consent forms completed and in the chart;  Patient's signature needed at discharge.  Patient to Follow up at: Follow-up Information    [facility] .   Why:  Referral sent 9/1. Please call Elenor Quinones or other admissions center staff to complete phone interview and discuss possible admission.  Contact information: [address],  [place], [place] [address] Phone: [phone]          Next level of care provider has access to [facility] Link:no  Safety Planning and Suicide Prevention discussed: Yes,  with patient and mother  Have you used any form of tobacco in the last 30 days? (Cigarettes, Smokeless Tobacco, Cigars, and/or Pipes): Yes  Has patient been referred to the Quitline?: Patient refused referral  Patient has been referred for addiction treatment: Yes  Corynne Scibilia L Darica Goren 10/10/2015, 9:58 AM

## 2015-10-10 NOTE — Progress Notes (Signed)
Recreation Therapy Notes  Date: 10/10/15 Time: 0930 Location: 300 Hall Group Room  Group Topic: Stress Management  Goal Area(s) Addresses:  Patient will verbalize importance of using healthy stress management.  Patient will identify positive emotions associated with healthy stress management.   Behavioral Response: Engaged  Intervention: Stress Management  Activity :  Guided Imagery.  LRT introduced the stress management technique of guided imagery to the patients.  LRT read a script so patients could participate in the activity.  Patients were to follow along as LRT read script to participate.  Education:  Stress Management, Discharge Planning.   Education Outcome: Acknowledges edcuation/In group clarification offered/Needs additional education  Clinical Observations/Feedback: Pt attended group.   Caroll Rancher, LRT/CTRS         Caroll Rancher A 10/10/2015 12:21 PM

## 2015-10-10 NOTE — Tx Team (Signed)
Interdisciplinary Treatment and Diagnostic Plan Update  10/10/2015 Time of Session: 9:30am CAMARION WEIER MRN: [id number]  Principal Diagnosis: Polysubstance dependence [facility])  Secondary Diagnoses: Principal Problem:   Polysubstance dependence ([place]) Active Problems:   MDD (major depressive disorder) ([place])   Severe episode of recurrent major depressive disorder, without psychotic features ([place])   Current Medications:  Current Facility-Administered Medications  Medication Dose Route Frequency Provider Last Rate Last Dose  . acetaminophen (TYLENOL) tablet 650 mg  650 mg Oral Q6H PRN Kerrie Buffalo, NP      . alum & mag hydroxide-simeth (MAALOX/MYLANTA) 200-200-20 MG/5ML suspension 30 mL  30 mL Oral Q4H PRN Kerrie Buffalo, NP   30 mL at 10/07/15 1425  . clonazePAM (KLONOPIN) tablet 0.25 mg  0.25 mg Oral TID PC Encarnacion Slates, NP   0.25 mg at 10/10/15 0811  . dicyclomine (BENTYL) tablet 20 mg  20 mg Oral Q6H PRN Norman Clay, MD   20 mg at 10/09/15 0827  . famotidine (PEPCID) tablet 10 mg  10 mg Oral BID Norman Clay, MD   10 mg at 10/10/15 0811  . feeding supplement (ENSURE ENLIVE) (ENSURE ENLIVE) liquid 237 mL  237 mL Oral BID BM Norman Clay, MD   237 mL at 10/09/15 1429  . gabapentin (NEURONTIN) capsule 600 mg  600 mg Oral TID PC & HS Encarnacion Slates, NP   600 mg at 10/10/15 0811  . hydrOXYzine (ATARAX/VISTARIL) tablet 25 mg  25 mg Oral Q6H PRN Norman Clay, MD   25 mg at 10/09/15 1849  . loperamide (IMODIUM) capsule 2-4 mg  2-4 mg Oral PRN Norman Clay, MD      . magnesium hydroxide (MILK OF MAGNESIA) suspension 30 mL  30 mL Oral Daily PRN Kerrie Buffalo, NP      . methocarbamol (ROBAXIN) tablet 500 mg  500 mg Oral Q8H PRN Norman Clay, MD      . naproxen (NAPROSYN) tablet 500 mg  500 mg Oral BID PRN Norman Clay, MD   500 mg at 10/08/15 0758  . nicotine (NICODERM CQ - dosed in mg/24 hours) patch 21 mg  21 mg Transdermal Daily Norman Clay, MD   21 mg at 10/10/15 0810  . ondansetron  (ZOFRAN-ODT) disintegrating tablet 4 mg  4 mg Oral Q6H PRN Norman Clay, MD      . QUEtiapine (SEROQUEL) tablet 100 mg  100 mg Oral QHS Encarnacion Slates, NP   100 mg at 10/09/15 2128  . sertraline (ZOLOFT) tablet 50 mg  50 mg Oral Daily Norman Clay, MD   50 mg at 10/10/15 0811  . traZODone (DESYREL) tablet 50 mg  50 mg Oral QHS Norman Clay, MD   50 mg at 10/09/15 2129   PTA Medications: Prescriptions Prior to Admission  Medication Sig Dispense Refill Last Dose  . LORazepam (ATIVAN) 1 MG tablet Take 1 tablet (1 mg total) by mouth 3 (three) times daily as needed for anxiety. 20 tablet 0 Past Week at Unknown time    Treatment Modalities: Medication Management, Group therapy, Case management,  1 to 1 session with clinician, Psychoeducation, Recreational therapy.   Physician Treatment Plan for Primary Diagnosis: Polysubstance dependence ([place]) Long Term Goal(s): Improvement in symptoms so as ready for discharge   Short Term Goals: Ability to identify changes in lifestyle to reduce recurrence of condition will improve and Ability to identify triggers associated with substance abuse/mental health issues will improve  Medication Management: Evaluate patient's response, side effects, and tolerance of medication  regimen.  Therapeutic Interventions: 1 to 1 sessions, Unit Group sessions and Medication administration.  Evaluation of Outcomes: Met  Physician Treatment Plan for Secondary Diagnosis: Principal Problem:   Polysubstance dependence ([place]) Active Problems:   MDD (major depressive disorder) ([facility])   Severe episode of recurrent major depressive disorder, without psychotic features ([place])  Long Term Goal(s): Improvement in symptoms so as ready for discharge  Short Term Goals: Ability to identify changes in lifestyle to reduce recurrence of condition will improve and Ability to identify triggers associated with substance abuse/mental health issues will improve  Medication Management: Evaluate  patient's response, side effects, and tolerance of medication regimen.  Therapeutic Interventions: 1 to 1 sessions, Unit Group sessions and Medication administration.  Evaluation of Outcomes: Met   RN Treatment Plan for Primary Diagnosis: Polysubstance dependence ([place]) Long Term Goal(s): Knowledge of disease and therapeutic regimen to maintain health will improve  Short Term Goals: Ability to verbalize feelings will improve and Ability to identify and develop effective coping behaviors will improve  Medication Management: RN will administer medications as ordered by provider, will assess and evaluate patient's response and provide education to patient for prescribed medication. RN will report any adverse and/or side effects to prescribing provider.  Therapeutic Interventions: 1 on 1 counseling sessions, Psychoeducation, Medication administration, Evaluate responses to treatment, Monitor vital signs and CBGs as ordered, Perform/monitor CIWA, COWS, AIMS and Fall Risk screenings as ordered, Perform wound care treatments as ordered.  Evaluation of Outcomes: Adequate for Discharge   LCSW Treatment Plan for Primary Diagnosis: Polysubstance dependence ([place]) Long Term Goal(s): Safe transition to appropriate next level of care at discharge, Engage patient in therapeutic group addressing interpersonal concerns.  Short Term Goals: Engage patient in aftercare planning with referrals and resources, Identify triggers associated with mental health/substance abuse issues and Increase skills for wellness and recovery  Therapeutic Interventions: Assess for all discharge needs, 1 to 1 time with Social worker, Explore available resources and support systems, Assess for adequacy in community support network, Educate family and significant other(s) on suicide prevention, Complete Psychosocial Assessment, Interpersonal group therapy.  Evaluation of Outcomes: Adequate for Discharge   Progress in Treatment  :  Attending groups: Yes  Participating in groups: Yes  Taking medication as prescribed: Yes, MD continuing to assess for appropriate medication regimen  Toleration medication: Yes  Family/Significant other contact made: Yes, CSW has spoken with patient's mother  Patient understands diagnosis: Yes  Discussing patient identified problems/goals with staff: Yes  Medical problems stabilized or resolved: Yes  Denies suicidal/homicidal ideation: Yes, denies   Issues/concerns per patient self-inventory: None reported  Other: N/A  New problem(s) identified: None reported at this time    New Short Term/Long Term Goal(s): None at this time    Discharge Plan or Barriers: Referral pending at [facility]. Patient plans to stay with family while awaiting bed.   Reason for Continuation of Hospitalization: Anxiety Depression Hallucinations [place]ia Medication stabilization Suicidal ideation Withdrawal symptoms  Estimated Length of Stay: Discharge anticipated for today 10/10/15    Attendees:  Patient:                        Physician: Dr. Shea Evans , MD  10/10/2015   9:30am  Nursing: Mayra Neer, Marilynne Halsted, Christa Lorelle [place]  10/10/2015 9:30am  RN Care Manager:   Social Workers: Tilden Fossa, [place],  [place] LCSW 10/10/2015 9:30am   Scribe for Treatment Team: Tilden Fossa, LCSW Clinical Social Worker  [facility] [phone]

## 2015-10-10 NOTE — [facility] Suicide Risk Assessment (Signed)
[facility] Discharge Suicide Risk Assessment   Principal Problem: Severe episode of recurrent major depressive disorder, without psychotic features [facility]) Discharge Diagnoses:  Patient Active Problem List   Diagnosis Date Noted  . 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 Polysubstance dependence ([facility]) [F19.20] 10/05/2015    Total Time spent with patient: 30 minutes  Musculoskeletal: Strength & Muscle Tone: within normal limits Gait & Station: normal Patient leans: N/A  Psychiatric Specialty Exam: Review of Systems  All other systems reviewed and are negative.   Blood pressure 121/72, pulse (!) 103, temperature 98.2 F (36.8 C), temperature source Oral, resp. rate 16, height 5\' 9"  (1.753 m), weight 71.6 kg (157 lb 12 oz).Body mass index is 23.3 kg/m.  General Appearance: Casual  Eye Contact::  Fair  Speech:  Clear and Coherent409  Volume:  Normal  Mood:  Euthymic  Affect:  Appropriate  Thought Process:  Goal Directed and Descriptions of Associations: Intact  Orientation:  Full (Time, Place, and Person)  Thought Content:  Logical  Suicidal Thoughts:  No  Homicidal Thoughts:  No  Memory:  Immediate;   Fair Recent;   Fair Remote;   Fair  Judgement:  Fair  Insight:  Fair  Psychomotor Activity:  Normal  Concentration:  Fair  Recall:  [id number] of Knowledge:Fair  Language: Fair  Akathisia:  No  Handed:  Right  AIMS (if indicated):     Assets:  Desire for Improvement  Sleep:  Number of Hours: 6.5  Cognition: WNL  ADL's:  Intact   Mental Status Per Nursing Assessment::   On Admission:     Demographic Factors:  Male and Caucasian  Loss Factors: NA  Historical Factors: Impulsivity  Risk Reduction Factors:   Positive social support  Continued Clinical Symptoms:  Alcohol/Substance Abuse/Dependencies  Cognitive Features That Contribute To Risk:  Polarized thinking    Suicide Risk:  Minimal: No  identifiable suicidal ideation.  Patients presenting with no risk factors but with morbid ruminations; may be classified as minimal risk based on the severity of the depressive symptoms  Follow-up Information    [facility] .   Why:  Referral sent 9/1. Please call 11/1 or other admissions center staff to complete phone interview and discuss possible admission.  Contact information: [address],  [employer], [place] Phone: [phone]          Plan Of Care/Follow-up recommendations:  Activity:  NO RESTRICTIONS Diet:  REGULAR Tests:  PLEASE GET HBA1C, LIPID PANEL ON AN OUT PATIENT BASIS  Kasson Lamere, MD 10/10/2015, 9:59 AM

## 2016-01-12 ENCOUNTER — Emergency Department (HOSPITAL_COMMUNITY)
Admission: EM | Admit: 2016-01-12 | Discharge: 2016-01-12 | Disposition: A | Discharge status: Home / Self Care | Payer: Self-pay | Attending: Emergency Medicine | Admitting: Emergency Medicine

## 2016-01-12 ENCOUNTER — Encounter (HOSPITAL_COMMUNITY): Payer: Self-pay

## 2016-01-12 DIAGNOSIS — F17210 Nicotine dependence, cigarettes, uncomplicated: Secondary | ICD-10-CM | POA: Insufficient documentation

## 2016-01-12 DIAGNOSIS — Z760 Encounter for issue of repeat prescription: Secondary | ICD-10-CM | POA: Insufficient documentation

## 2016-01-12 DIAGNOSIS — F19939 Other psychoactive substance use, unspecified with withdrawal, unspecified: Secondary | ICD-10-CM | POA: Insufficient documentation

## 2016-01-12 DIAGNOSIS — R197 Diarrhea, unspecified: Secondary | ICD-10-CM | POA: Insufficient documentation

## 2016-01-12 DIAGNOSIS — R1030 Lower abdominal pain, unspecified: Secondary | ICD-10-CM | POA: Insufficient documentation

## 2016-01-12 DIAGNOSIS — I10 Essential (primary) hypertension: Secondary | ICD-10-CM | POA: Insufficient documentation

## 2016-01-12 DIAGNOSIS — R112 Nausea with vomiting, unspecified: Secondary | ICD-10-CM | POA: Insufficient documentation

## 2016-01-12 DIAGNOSIS — Z720 Tobacco use: Secondary | ICD-10-CM

## 2016-01-12 DIAGNOSIS — Z79899 Other long term (current) drug therapy: Secondary | ICD-10-CM | POA: Insufficient documentation

## 2016-01-12 LAB — URINALYSIS, ROUTINE W REFLEX MICROSCOPIC
Bilirubin Urine: NEGATIVE
Glucose, UA: NEGATIVE mg/dL
Hgb urine dipstick: NEGATIVE
Ketones, ur: NEGATIVE mg/dL
Leukocytes, UA: NEGATIVE
NITRITE: NEGATIVE
Protein, ur: NEGATIVE mg/dL
SPECIFIC GRAVITY, URINE: 1.01 (ref 1.005–1.030)
pH: 6 (ref 5.0–8.0)

## 2016-01-12 LAB — ETHANOL

## 2016-01-12 LAB — COMPREHENSIVE METABOLIC PANEL
ALK PHOS: 63 U/L (ref 38–126)
ALT: 19 U/L (ref 17–63)
ANION GAP: 10 (ref 5–15)
AST: 21 U/L (ref 15–41)
Albumin: 4.3 g/dL (ref 3.5–5.0)
BUN: 7 mg/dL (ref 6–20)
CALCIUM: 8.6 mg/dL — AB (ref 8.9–10.3)
CO2: 22 mmol/L (ref 22–32)
Chloride: 106 mmol/L (ref 101–111)
Creatinine, Ser: 0.74 mg/dL (ref 0.61–1.24)
GFR calc non Af Amer: >60 mL/min (ref >60)
Glucose, Bld: 103 mg/dL — ABNORMAL HIGH (ref 65–99)
POTASSIUM: 3.4 mmol/L — AB (ref 3.5–5.1)
SODIUM: 138 mmol/L (ref 135–145)
TOTAL PROTEIN: 7 g/dL (ref 6.5–8.1)
Total Bilirubin: 0.7 mg/dL (ref 0.3–1.2)

## 2016-01-12 LAB — RAPID URINE DRUG SCREEN, HOSP PERFORMED
Amphetamines: NOT DETECTED
BENZODIAZEPINES: POSITIVE — AB
Barbiturates: NOT DETECTED
Cocaine: NOT DETECTED
Opiates: NOT DETECTED
Tetrahydrocannabinol: NOT DETECTED

## 2016-01-12 LAB — SALICYLATE LEVEL

## 2016-01-12 LAB — CBC
HEMATOCRIT: 38.1 % — AB (ref 39.0–52.0)
HEMOGLOBIN: 13.1 g/dL (ref 13.0–17.0)
MCH: 30 pg (ref 26.0–34.0)
MCHC: 34.4 g/dL (ref 30.0–36.0)
MCV: 87.2 fL (ref 78.0–100.0)
Platelets: 180 10*3/uL (ref 150–400)
RBC: 4.37 MIL/uL (ref 4.22–5.81)
RDW: 13.3 % (ref 11.5–15.5)
WBC: 7.3 10*3/uL (ref 4.0–10.5)

## 2016-01-12 LAB — LIPASE, BLOOD: Lipase: 99 U/L — ABNORMAL HIGH (ref 11–51)

## 2016-01-12 LAB — I-STAT CG4 LACTIC ACID, ED: LACTIC ACID, VENOUS: 0.79 mmol/L (ref 0.5–1.9)

## 2016-01-12 LAB — ACETAMINOPHEN LEVEL

## 2016-01-12 MED ORDER — LORAZEPAM 1 MG PO TABS
2.0000 mg | ORAL_TABLET | Freq: Once | ORAL | Status: AC
  Administered 2016-01-12: 2 mg via ORAL
  Filled 2016-01-12: qty 2

## 2016-01-12 MED ORDER — ONDANSETRON HCL 8 MG PO TABS: 8.0000 mg | ORAL_TABLET | Freq: Three times a day (TID) | ORAL | 0 refills | Status: DC | PRN

## 2016-01-12 MED ORDER — GABAPENTIN 400 MG PO CAPS: 800.0000 mg | ORAL_CAPSULE | Freq: Three times a day (TID) | ORAL | 0 refills | Status: DC

## 2016-01-12 MED ORDER — GABAPENTIN 400 MG PO CAPS
800.0000 mg | ORAL_CAPSULE | Freq: Once | ORAL | Status: AC
  Administered 2016-01-12: 800 mg via ORAL
  Filled 2016-01-12: qty 2

## 2016-01-12 MED ORDER — PENTAFLUOROPROP-TETRAFLUOROETH EX AERO: INHALATION_SPRAY | Freq: Once | CUTANEOUS | Status: DC

## 2016-01-12 MED ORDER — CLONIDINE HCL 0.1 MG PO TABS
0.1000 mg | ORAL_TABLET | Freq: Every day | ORAL | 0 refills | Status: DC
Start: 2016-01-12 — End: 2016-01-17

## 2016-01-12 MED ORDER — ONDANSETRON 8 MG PO TBDP
8.0000 mg | ORAL_TABLET | Freq: Once | ORAL | Status: AC
  Administered 2016-01-12: 8 mg via ORAL
  Filled 2016-01-12: qty 1

## 2016-01-12 MED ORDER — LIDOCAINE HCL (PF) 1 % IJ SOLN: 10.0000 mL | Freq: Once | INTRAMUSCULAR | Status: DC

## 2016-01-12 NOTE — Progress Notes (Signed)
Entered in d/c instructions Please use the resources provided to you in emergency room by case manager to assist you're your choice of doctor for follow up  These [place] uninsured resources provide possible primary care providers, resources for discounted medications, housing, dental resources, affordable care act information, plus other resources for [place]   

## 2016-01-12 NOTE — Progress Notes (Signed)
Pt confirmed with ED CM that he does not have a pcp and recently moved to [place] "four months ago"   CM discussed and provided written information to assist pt with determining choice for uninsured accepting pcps, discussed the importance of pcp vs EDP services for f/u care, www.needymeds.org, www.goodrx.com, discounted pharmacies and other [employer] such as [facility] , [employer], affordable care act, financial assistance, uninsured dental services, [place] med assist, DSS and  health department  Reviewed resources for [employer] uninsured accepting pcps like [facility], family medicine at [facility], [facility], [facility], local urgent care centers, [facility], [facility] family practice, general medical clinics, family services of the [place], [facility] urgent care plus others, medication resources, [facility] out patient pharmacies and housing Pt voiced understanding and appreciation of resources provided   Provided [facility] contact information

## 2016-01-12 NOTE — ED Provider Notes (Signed)
[facility]-EMERGENCY DEPT Provider Note   CSN: [id number] Arrival date & time: 01/12/16  1042     History   Chief Complaint Chief Complaint  Patient presents with  . Emesis  . Abdominal Pain    HPI Kenneth Elliott is a 40 y.o. male with a PMHx of polysubstance abuse, HTN, lupus, RA, and seizures, who presents to the ED with complaints of detox and withdrawal. Patient states that he was started on Suboxone in [place] at a treatment center quite some time ago, but he's been out of his Suboxone for 9 days (states he "doesn't want that stuff anymore"), and that he ran out of his clonidine and Neurontin 3 days ago as well as running out of his Ativan yesterday. He's experiencing symptoms similar to the symptoms he's had with withdrawal in the past, including nausea, 8 episodes of nonbloody nonbilious emesis in the last 24 hours, 3 episodes of nonbloody diarrhea in the last 24 hours, generalized myalgias, chills, sweats, and fatigue. He also complains of 7/10 intermittent crampy nonradiating lower abdominal pain with no known aggravating factors and unrelieved by ibuprofen and Pepto-Bismol. He states all the symptoms are the same as his withdrawal symptoms. Chart review reveals he was seen in Aug 2017 here, once for withdrawal and d/c with outpatient resources, then again later in Aug 2017 but was stating he was suicidal so he was admitted to [facility] for several days. He states that since leaving, he hasn't established care with anyone, but that he has plans to go to [place] for an IP detox admission.   He denies SI/HI/AVH, illicit drug use, EtOH use, fevers, CP, SOB, hematemesis, melena, hematochezia, constipation, obstipation, hematuria, dysuria, arthralgias, numbness, tingling, weakness, or any other complaints at this time.    The history is provided by the patient and medical records. No language interpreter was used.  Emesis   Associated symptoms include abdominal pain, chills, diarrhea and myalgias.  Pertinent negatives include no arthralgias and no fever.  Abdominal Pain   This is a recurrent problem. The current episode started more than 2 days ago. The problem occurs daily. The problem has not changed since onset.The pain is associated with medication withdrawal. The pain is located in the suprapubic region, RLQ and LLQ. The quality of the pain is cramping. The pain is at a severity of 7/10. The pain is moderate. Associated symptoms include diarrhea, nausea, vomiting and myalgias. Pertinent negatives include fever, flatus, hematochezia, melena, constipation, dysuria, hematuria and arthralgias. Nothing aggravates the symptoms. Nothing relieves the symptoms.    Past Medical History:  Diagnosis Date  . Hypertension   . Lupus   . Rheumatoid arthritis (HCC)   . Seizures (HCC)    per patient    Patient Active Problem List   Diagnosis Date Noted  . Severe episode of recurrent major depressive disorder, without psychotic features (HCC)   . Polysubstance dependence (HCC) 10/05/2015    History reviewed. No pertinent surgical history.     Home Medications    Prior to Admission medications   Medication Sig Start Date End Date Taking? Authorizing Provider  clonazePAM (KLONOPIN) 0.5 MG tablet Take 0.5 tablets (0.25 mg total) by mouth 3 (three) times daily after meals. For anxiety 10/10/15   Sanjuana Kava, NP  famotidine (PEPCID) 10 MG tablet Take 1 tablet (10 mg total) by mouth 2 (two) times daily. For acid reflux 10/10/15   Sanjuana Kava, NP  gabapentin (NEURONTIN) 300 MG capsule Take 2 capsules (600 mg total)  by mouth 4 (four) times daily - after meals and at bedtime. For agitation 10/10/15   Sanjuana Kava, NP  hydrOXYzine (ATARAX/VISTARIL) 25 MG tablet Take 1 tablet (25 mg total) by mouth every 6 (six) hours as needed for anxiety. 10/10/15   Sanjuana Kava, NP  nicotine (NICODERM CQ - DOSED IN MG/24 HOURS) 21 mg/24hr patch Place 1 patch (21 mg total) onto the skin daily. For nicotine dependence  10/10/15   Sanjuana Kava, NP  QUEtiapine (SEROQUEL) 100 MG tablet Take 1 tablet (100 mg total) by mouth at bedtime. For mood control 10/10/15   Sanjuana Kava, NP  sertraline (ZOLOFT) 50 MG tablet Take 1 tablet (50 mg total) by mouth daily. For depression 10/10/15   Sanjuana Kava, NP  traZODone (DESYREL) 50 MG tablet Take 1 tablet (50 mg total) by mouth at bedtime. For sleep 10/10/15   Sanjuana Kava, NP    Family History History reviewed. No pertinent family history.  Social History Social History  Substance Use Topics  . Smoking status: Current Every Day Smoker    Packs/day: 1.00    Types: Cigarettes  . Smokeless tobacco: Current User  . Alcohol use Yes     Allergies   Patient has no known allergies.   Review of Systems Review of Systems  Constitutional: Positive for chills and fatigue. Negative for fever.  Respiratory: Negative for shortness of breath.   Cardiovascular: Negative for chest pain.  Gastrointestinal: Positive for abdominal pain, diarrhea, nausea and vomiting. Negative for blood in stool, constipation, flatus, hematochezia and melena.  Genitourinary: Negative for dysuria and hematuria.  Musculoskeletal: Positive for myalgias. Negative for arthralgias.  Skin: Negative for color change.  Allergic/Immunologic: Positive for immunocompromised state (lupus and RA).  Neurological: Negative for weakness and numbness.  Psychiatric/Behavioral: Negative for confusion, hallucinations and suicidal ideas.   10 Systems reviewed and are negative for acute change except as noted in the HPI.   Physical Exam Updated Vital Signs BP 151/96 (BP Location: Left Arm)   Pulse 112   Temp 98.4 F (36.9 C) (Oral)   Resp 18   SpO2 100%   Physical Exam  Constitutional: He is oriented to person, place, and time. Vital signs are normal. He appears well-developed and well-nourished.  Non-toxic appearance. No distress.  Afebrile, nontoxic, NAD  HENT:  Head: Normocephalic and atraumatic.    Mouth/Throat: Oropharynx is clear and moist. Mucous membranes are dry.  Mildly dry lips  Eyes: Conjunctivae and EOM are normal. Right eye exhibits no discharge. Left eye exhibits no discharge.  Neck: Normal range of motion. Neck supple.  Cardiovascular: Regular rhythm, normal heart sounds and intact distal pulses.  Tachycardia present.  Exam reveals no gallop and no friction rub.   No murmur heard. Mildly tachycardic  Pulmonary/Chest: Effort normal and breath sounds normal. No respiratory distress. He has no decreased breath sounds. He has no wheezes. He has no rhonchi. He has no rales.  Abdominal: Soft. Normal appearance and bowel sounds are normal. He exhibits no distension. There is tenderness in the right lower quadrant, suprapubic area and left lower quadrant. There is no rigidity, no rebound, no guarding, no CVA tenderness, no tenderness at McBurney's point and negative Murphy's sign.  Soft, nondistended, +BS throughout, with mild lower abd TTP, no r/g/r, neg murphy's, neg mcburney's, no CVA TTP   Musculoskeletal: Normal range of motion.  Neurological: He is alert and oriented to person, place, and time. He has normal strength. No sensory deficit.  Skin: Skin is warm, dry and intact. No rash noted.  Psychiatric: He has a normal mood and affect. His speech is normal and behavior is normal. Thought content normal. He is not actively hallucinating.  Denies SI/HI/AVH  Nursing note and vitals reviewed.    ED Treatments / Results  Labs (all labs ordered are listed, but only abnormal results are displayed) Labs Reviewed  LIPASE, BLOOD - Abnormal; Notable for the following:       Result Value   Lipase 99 (*)    All other components within normal limits  COMPREHENSIVE METABOLIC PANEL - Abnormal; Notable for the following:    Potassium 3.4 (*)    Glucose, Bld 103 (*)    Calcium 8.6 (*)    All other components within normal limits  CBC - Abnormal; Notable for the following:    HCT 38.1  (*)    All other components within normal limits  ACETAMINOPHEN LEVEL - Abnormal; Notable for the following:    Acetaminophen (Tylenol), Serum <10 (*)    All other components within normal limits  RAPID URINE DRUG SCREEN, HOSP PERFORMED - Abnormal; Notable for the following:    Benzodiazepines POSITIVE (*)    All other components within normal limits  URINALYSIS, ROUTINE W REFLEX MICROSCOPIC  ETHANOL  SALICYLATE LEVEL  I-STAT CG4 LACTIC ACID, ED    EKG  EKG Interpretation None       Radiology No results found.  Procedures Procedures (including critical care time)  Medications Ordered in ED Medications  gabapentin (NEURONTIN) capsule 800 mg (not administered)  LORazepam (ATIVAN) tablet 2 mg (2 mg Oral Given 01/12/16 1314)  ondansetron (ZOFRAN-ODT) disintegrating tablet 8 mg (8 mg Oral Given 01/12/16 1314)     Initial Impression / Assessment and Plan / ED Course  I have reviewed the triage vital signs and the nursing notes.  Pertinent labs & imaging results that were available during my care of the patient were reviewed by me and considered in my medical decision making (see chart for details).  Clinical Course     40 y.o. male here requesting detox due to withdrawal symptoms from running out of his regular meds. States he ran out of ativan yesterday, neurontin and clonidine 3 days ago, and suboxone 9 days ago. W/D symptoms include n/v/d, abd pain, chills, fatigue, and myalgias. States this is typical of his withdrawal. On exam, mild lower abd tenderness,nonperitoneal, no upper abd tenderness or murphy's exam finding. Mildly tachycardic, dry lips. Has been seen here twice in the past, once for same complaints and was sent home with rx for ativan, and the other time he c/o suicidal thoughts so he was admitted to the behavioral unit for a few days. He states he's going to be going to [place] for a detox facility admission, but is requesting refills of his meds. Denies SI/HI/AVH,  EtOH use, or illicit drug use. +Smoker. CBC unremarkable, CMP WNL; lipase 99 which isn't high enough to think this is acute pancreatitis, and pain isn't really in the right location, but given his lupus pancreatitis is still on the list of differentials. U/A pending. Awaiting lactic acid level as well. Will add-on UDS, EtOH, salicylate/acetaminophen levels. Will give zofran and ativan, and reassess shortly. Will need to figure out if he truly needs TTS consultation, vs just outpatient resources. Will also need to check database.  3:43 PM Lactic WNL. U/A unremarkable. UDS with +benzos but otherwise neg. EtOH level neg. Salicylate and Acetaminophen levels neg. Pt feeling better  and tolerating PO well, vitals improved after meds.  [facility] database reviewed prior to dispensing controlled substance medications, and was notable for: Ativan 1mg  #30 tabs on 09/28/15, Klonopin 0.5mg  #12 tabs on 10/10/15, and Suboxone 8mg -2mg  #60 SL films on 12/16/15. THIS IS NOT CONSISTENT WITH WHAT HE'S STATING, therefore I feel it would be risky to send him home with ativan Rx. However I feel we could send him home with clonidine and neurontin refills. Will also rx zofran for nausea. Outpatient resources given, discussed f/up with these to get a PCP, and to find substance abuse facility. Smoking cessation encouraged. I explained the diagnosis and have given explicit precautions to return to the ER including for any other new or worsening symptoms. The patient understands and accepts the medical plan as it's been dictated and I have answered their questions. Discharge instructions concerning home care and prescriptions have been given. The patient is STABLE and is discharged to home in good condition.    Final Clinical Impressions(s) / ED Diagnoses   Final diagnoses:  Withdrawal from other psychoactive substance (HCC)  Nausea vomiting and diarrhea  Lower abdominal pain  Tobacco use  Encounter for medication refill    New  Prescriptions New Prescriptions   CLONIDINE (CATAPRES) 0.1 MG TABLET    Take 1 tablet (0.1 mg total) by mouth daily.   GABAPENTIN (NEURONTIN) 400 MG CAPSULE    Take 2 capsules (800 mg total) by mouth 3 (three) times daily.   ONDANSETRON (ZOFRAN) 8 MG TABLET    Take 1 tablet (8 mg total) by mouth every 8 (eight) hours as needed for nausea or vomiting.     Allen Derry, PA-C 01/12/16 1545    Laurence Spates, MD 01/13/16 [phone]

## 2016-01-12 NOTE — ED Triage Notes (Signed)
Pt states he is out of his ativan, clonidine. Suboxone, neurontin x 3 days.  WDL symptoms.  Emesis with abd pain.  No fever

## 2016-01-12 NOTE — ED Triage Notes (Signed)
Per EMS, pt states n/v x 1 week.  No n/v on route.  Pt state he is out of his medication.  Ativan.  Pt picked up at hotel.  Ambulatory on arrival.  Vitals:  170/100, hr 78, resp 20, 98% ra

## 2016-01-12 NOTE — ED Notes (Signed)
Bed: YK99 Expected date:  Expected time:  Means of arrival:  Comments: Detox, triage

## 2016-01-12 NOTE — Discharge Instructions (Signed)
Use the list of resources below to find a regular doctor, and a substance abuse facility. Your Neurontin and your clonidine were refilled today as a courtesy, but you need to follow up with a regular doctor for future refills. You will need to follow up with a doctor for refills of your ativan as well, this will not be refilled for you today since it is a controlled substance and requires close monitoring. Stay hydrated. Use tylenol or motrin as needed for pain. Use zofran for nausea. Stop smoking cigarettes! Return to the ER for changes or worsening symptoms.

## 2016-01-16 ENCOUNTER — Encounter (HOSPITAL_COMMUNITY): Payer: Self-pay | Admitting: *Deleted

## 2016-01-16 ENCOUNTER — Observation Stay (HOSPITAL_COMMUNITY)
Admission: AD | Admit: 2016-01-16 | Discharge: 2016-01-17 | Disposition: A | Discharge status: Home / Self Care | Payer: Self-pay | Source: Intra-hospital | Attending: Psychiatry | Admitting: Psychiatry

## 2016-01-16 ENCOUNTER — Emergency Department (HOSPITAL_COMMUNITY)
Admission: EM | Admit: 2016-01-16 | Discharge: 2016-01-16 | Disposition: A | Discharge status: Other Acute Inpatient Hospital | Payer: Self-pay | Attending: Emergency Medicine | Admitting: Emergency Medicine

## 2016-01-16 DIAGNOSIS — R45851 Suicidal ideations: Secondary | ICD-10-CM | POA: Insufficient documentation

## 2016-01-16 DIAGNOSIS — F419 Anxiety disorder, unspecified: Secondary | ICD-10-CM | POA: Insufficient documentation

## 2016-01-16 DIAGNOSIS — F332 Major depressive disorder, recurrent severe without psychotic features: Principal | ICD-10-CM | POA: Insufficient documentation

## 2016-01-16 DIAGNOSIS — M069 Rheumatoid arthritis, unspecified: Secondary | ICD-10-CM | POA: Insufficient documentation

## 2016-01-16 DIAGNOSIS — Z79899 Other long term (current) drug therapy: Secondary | ICD-10-CM | POA: Insufficient documentation

## 2016-01-16 DIAGNOSIS — F17210 Nicotine dependence, cigarettes, uncomplicated: Secondary | ICD-10-CM | POA: Insufficient documentation

## 2016-01-16 DIAGNOSIS — F1910 Other psychoactive substance abuse, uncomplicated: Secondary | ICD-10-CM | POA: Insufficient documentation

## 2016-01-16 DIAGNOSIS — F1994 Other psychoactive substance use, unspecified with psychoactive substance-induced mood disorder: Secondary | ICD-10-CM | POA: Diagnosis present

## 2016-01-16 DIAGNOSIS — Z811 Family history of alcohol abuse and dependence: Secondary | ICD-10-CM | POA: Insufficient documentation

## 2016-01-16 DIAGNOSIS — I10 Essential (primary) hypertension: Secondary | ICD-10-CM | POA: Insufficient documentation

## 2016-01-16 DIAGNOSIS — Z818 Family history of other mental and behavioral disorders: Secondary | ICD-10-CM | POA: Insufficient documentation

## 2016-01-16 DIAGNOSIS — M329 Systemic lupus erythematosus, unspecified: Secondary | ICD-10-CM | POA: Insufficient documentation

## 2016-01-16 DIAGNOSIS — R569 Unspecified convulsions: Secondary | ICD-10-CM | POA: Insufficient documentation

## 2016-01-16 LAB — COMPREHENSIVE METABOLIC PANEL
ALK PHOS: 76 U/L (ref 38–126)
ALT: 25 U/L (ref 17–63)
ANION GAP: 11 (ref 5–15)
AST: 20 U/L (ref 15–41)
Albumin: 4.3 g/dL (ref 3.5–5.0)
BILIRUBIN TOTAL: 0.6 mg/dL (ref 0.3–1.2)
BUN: 6 mg/dL (ref 6–20)
CALCIUM: 9.5 mg/dL (ref 8.9–10.3)
CO2: 25 mmol/L (ref 22–32)
CREATININE: 0.82 mg/dL (ref 0.61–1.24)
Chloride: 106 mmol/L (ref 101–111)
Glucose, Bld: 103 mg/dL — ABNORMAL HIGH (ref 65–99)
Potassium: 3 mmol/L — ABNORMAL LOW (ref 3.5–5.1)
Sodium: 142 mmol/L (ref 135–145)
TOTAL PROTEIN: 7.3 g/dL (ref 6.5–8.1)

## 2016-01-16 LAB — CBC
HCT: 41.7 % (ref 39.0–52.0)
HEMOGLOBIN: 14.4 g/dL (ref 13.0–17.0)
MCH: 29.4 pg (ref 26.0–34.0)
MCHC: 34.5 g/dL (ref 30.0–36.0)
MCV: 85.3 fL (ref 78.0–100.0)
Platelets: 230 10*3/uL (ref 150–400)
RBC: 4.89 MIL/uL (ref 4.22–5.81)
RDW: 13.3 % (ref 11.5–15.5)
WBC: 10.9 10*3/uL — ABNORMAL HIGH (ref 4.0–10.5)

## 2016-01-16 LAB — SALICYLATE LEVEL

## 2016-01-16 LAB — ETHANOL: Alcohol, Ethyl (B): <5 mg/dL (ref <5)

## 2016-01-16 LAB — RAPID URINE DRUG SCREEN, HOSP PERFORMED
Amphetamines: NOT DETECTED
BARBITURATES: NOT DETECTED
Benzodiazepines: NOT DETECTED
COCAINE: NOT DETECTED
OPIATES: NOT DETECTED
TETRAHYDROCANNABINOL: NOT DETECTED

## 2016-01-16 LAB — ACETAMINOPHEN LEVEL

## 2016-01-16 MED ORDER — NICOTINE 21 MG/24HR TD PT24: 21.0000 mg | MEDICATED_PATCH | Freq: Every day | TRANSDERMAL | Status: DC

## 2016-01-16 MED ORDER — LORAZEPAM 0.5 MG PO TABS
0.0000 mg | ORAL_TABLET | Freq: Four times a day (QID) | ORAL | Status: DC
  Administered 2016-01-16: 1 mg via ORAL
  Filled 2016-01-16: qty 4

## 2016-01-16 MED ORDER — LORAZEPAM 0.5 MG PO TABS
1.0000 mg | ORAL_TABLET | Freq: Once | ORAL | Status: AC
  Administered 2016-01-16: 1 mg via ORAL
  Filled 2016-01-16: qty 2

## 2016-01-16 MED ORDER — IBUPROFEN 400 MG PO TABS
600.0000 mg | ORAL_TABLET | Freq: Three times a day (TID) | ORAL | Status: DC | PRN
  Administered 2016-01-16: 600 mg via ORAL
  Filled 2016-01-16: qty 1

## 2016-01-16 MED ORDER — GABAPENTIN 800 MG PO TABS: 800.0000 mg | ORAL_TABLET | Freq: Three times a day (TID) | ORAL | Status: DC

## 2016-01-16 MED ORDER — CLONIDINE HCL 0.1 MG PO TABS: 0.1000 mg | ORAL_TABLET | Freq: Three times a day (TID) | ORAL | Status: DC

## 2016-01-16 MED ORDER — POTASSIUM CHLORIDE CRYS ER 20 MEQ PO TBCR
40.0000 meq | EXTENDED_RELEASE_TABLET | Freq: Once | ORAL | Status: AC
  Administered 2016-01-16: 40 meq via ORAL
  Filled 2016-01-16: qty 2

## 2016-01-16 MED ORDER — ALBUTEROL SULFATE HFA 108 (90 BASE) MCG/ACT IN AERS: 2.0000 | INHALATION_SPRAY | Freq: Four times a day (QID) | RESPIRATORY_TRACT | Status: DC | PRN

## 2016-01-16 MED ORDER — IBUPROFEN 800 MG PO TABS: 800.0000 mg | ORAL_TABLET | Freq: Four times a day (QID) | ORAL | Status: DC | PRN

## 2016-01-16 MED ORDER — ONDANSETRON HCL 4 MG PO TABS
4.0000 mg | ORAL_TABLET | Freq: Three times a day (TID) | ORAL | Status: DC | PRN
  Filled 2016-01-16: qty 1

## 2016-01-16 MED ORDER — LORAZEPAM 0.5 MG PO TABS: 0.0000 mg | ORAL_TABLET | Freq: Two times a day (BID) | ORAL | Status: DC

## 2016-01-16 MED ORDER — LORAZEPAM 1 MG PO TABS: 1.0000 mg | ORAL_TABLET | Freq: Three times a day (TID) | ORAL | Status: DC | PRN

## 2016-01-16 MED ORDER — LORATADINE 10 MG PO TABS: 10.0000 mg | ORAL_TABLET | Freq: Every day | ORAL | Status: DC

## 2016-01-16 MED ORDER — ONDANSETRON HCL 4 MG PO TABS
8.0000 mg | ORAL_TABLET | Freq: Three times a day (TID) | ORAL | Status: DC | PRN
  Administered 2016-01-16: 8 mg via ORAL

## 2016-01-16 MED ORDER — ONDANSETRON 4 MG PO TBDP
4.0000 mg | ORAL_TABLET | Freq: Once | ORAL | Status: AC | PRN
  Administered 2016-01-16: 4 mg via ORAL

## 2016-01-16 MED ORDER — SERTRALINE HCL 50 MG PO TABS: 100.0000 mg | ORAL_TABLET | Freq: Every day | ORAL | Status: DC

## 2016-01-16 MED ORDER — ONDANSETRON 4 MG PO TBDP
ORAL_TABLET | ORAL | Status: AC
  Filled 2016-01-16: qty 1

## 2016-01-16 NOTE — ED Notes (Signed)
Provided patient with turkey sandwich and ginger ale

## 2016-01-16 NOTE — BH Assessment (Addendum)
Assessment Note  Kenneth Elliott is an 40 y.o. male. He presents to [facility] with a complaint of depression, suicidal ideations, and polysubstance abuse. Patient with increased depressive symptoms such as hopelessness, isolating self from others, fatigue, and loss of interest in usual pleasures. He reports decreased appetite. Patient loss 12 pounds in the past 1.5 week. He also reports lack of sleep (1-2 hours per night). Current stressors include polysubstance abuse and separation from spouse. Sts that his spouse separated from him until he agrees to "get clean"/drug free. Patient today requesting help. Sts, "If I don't get help I'm going to harm myself". He does admit to suicidal ideations with a plan to overdose or "slit wrist". He has access to medications and sharp objects. Patient is not able to contract for safety at this time. He has tried to harm self in the past by cutting self. Patient reports a increase in anxiety; daily panic attacks. Patient's last anxiety attack was last night. No HI. No legal issues. No history of aggressive or assaultive behaviors. No family history of mental health illness or substance abuse. No history of physical, emotional, and/or sexual abuse. Patient has a history of polysubstance abuse. See ADDITIONAL SOCIAL HISTORY for details related to current and past substance use.   Diagnosis: Major Depressive Disorder, Recurrent, Severe, Without psychotic features; Anxiety Disorder; and Polysubstance Abuse  Past Medical History:  Past Medical History:  Diagnosis Date  . Hypertension   . Lupus   . Rheumatoid arthritis (HCC)   . Seizures (HCC)    per patient    History reviewed. No pertinent surgical history.  Family History: No family history on file.  Social History:  reports that he has been smoking Cigarettes.  He has been smoking about 1.00 pack per day. He uses smokeless tobacco. He reports that he drinks alcohol. He reports that he uses drugs, including  Cocaine.  Additional Social History:  Alcohol / Drug Use Pain Medications: Reports abuse of Rx opiates occasionally Prescriptions: Reports ativan daily History of alcohol / drug use?: Yes Longest period of sobriety (when/how long): 3 months  Withdrawal Symptoms: Agitation, Blackouts, Irritability, Fever / Chills, Sweats, Cramps, Nausea / Vomiting, Seizures Onset of Seizures: 7 yrs ago  Date of most recent seizure: "4 months ago" Substance #1 Name of Substance 1: Suboxone   (I was going to a [facility]....now I'm going to a place off of [address] but I can't recall the name of the place.") 1 - Age of First Use: 40 yrs old  1 - Amount (size/oz): 16 mg's per day  1 - Frequency: 2x's per day; 16mg 's per day  1 - Duration: "Off and On for the past 10 yrs" 1 - Last Use / Amount: 7 days ago  Substance #2 Name of Substance 2: Cocaine  2 - Age of First Use: 40 yrs old 2 - Amount (size/oz): 8 ball 2 - Frequency: 8 ball every 2 days  2 - Duration: on-going  2 - Last Use / Amount: 5 days ago  Substance #3 Name of Substance 3: Alcohol  3 - Age of First Use: 40 yrs old  3 - Amount (size/oz): "Anywhere from 1 pint to 1 gallon of liqour" 3 - Frequency: "every day" or "every other day" 3 - Duration: on-going  3 - Last Use / Amount: 01/15/2016 Substance #4 Name of Substance 4: Nicotine/Cigarettes 4 - Age of First Use: 14 4 - Amount (size/oz): 1 pack per day 4 -  Frequency: daily  4 - Duration: ongoing 4 - Last Use / Amount: 01/16/2016 Substance #5 Name of Substance 5: Opiates "Pain Pills" 5 - Age of First Use: "20 or 21" 5 - Amount (size/oz): 2-3 80mg  oxycodone  5 - Frequency: daily  5 - Duration: on-going  5 - Last Use / Amount: "A little under a week"  CIWA: CIWA-Ar BP: 170/95 Pulse Rate: 75 Nausea and Vomiting: mild nausea with no vomiting Tactile Disturbances: none Tremor: no tremor Auditory Disturbances: not present Paroxysmal Sweats: barely perceptible  sweating, palms moist Visual Disturbances: not present Anxiety: two Headache, Fullness in Head: very mild Agitation: somewhat more than normal activity Orientation and Clouding of Sensorium: oriented and can do serial additions CIWA-Ar Total: 6 COWS:    Allergies: No Known Allergies  Home Medications:  (Not in a hospital admission)  OB/GYN Status:  No LMP for male patient.  General Assessment Data Location of Assessment: [facility] ED TTS Assessment: In system Is this a Tele or Face-to-Face Assessment?: Face-to-Face Is this an Initial Assessment or a Re-assessment for this encounter?: Initial Assessment Marital status: Married [place] name:  (n/a) Is patient pregnant?: No Pregnancy Status: No Living Arrangements: Alone Can pt return to current living arrangement?: Yes Admission Status: Voluntary Is patient capable of signing voluntary admission?: Yes Referral Source: Self/Family/Friend Insurance type:  [employer])     Crisis Care Plan Living Arrangements: Alone Legal Guardian: Other: (no legal guardian ) Name of Psychiatrist:  (no psychiatrist ) Name of Therapist:  (no therapist )  Education Status Is patient currently in school?: No Current Grade:  (n/a) Highest grade of school patient has completed:  (12th grade ) Name of school:  (n/a) Contact person:  (n/a)  Risk to self with the past 6 months Suicidal Ideation: Yes-Currently Present Has patient been a risk to self within the past 6 months prior to admission? : Yes Suicidal Intent: Yes-Currently Present Has patient had any suicidal intent within the past 6 months prior to admission? : Yes Is patient at risk for suicide?: Yes Suicidal Plan?: Yes-Currently Present Has patient had any suicidal plan within the past 6 months prior to admission? : Yes Specify Current Suicidal Plan:  ("overdosing or slitting my wrist") Access to Means: Yes Specify Access to Suicidal Means:  (access to medications to overdose  and sharp objects) What has been your use of drugs/alcohol within the last 12 months?:  (polysubtance abuse) Previous Attempts/Gestures: Yes How many times?:  (1x cut self ) Other Self Harm Risks:  (denies ) Intentional Self Injurious Behavior: None Family Suicide History: Unknown Recent stressful life event(s): Other (Comment) (substance use, away from my spouse due to drug use ) Persecutory voices/beliefs?: No Depression: Yes Depression Symptoms: Feeling angry/irritable, Feeling worthless/self pity, Loss of interest in usual pleasures, Guilt, Fatigue, Isolating Substance abuse history and/or treatment for substance abuse?: No Suicide prevention information given to non-admitted patients: Not applicable  Risk to Others within the past 6 months Homicidal Ideation: No Does patient have any lifetime risk of violence toward others beyond the six months prior to admission? : No Thoughts of Harm to Others: No Current Homicidal Intent: No Current Homicidal Plan: No Access to Homicidal Means: No Identified Victim:  (n/a) History of harm to others?: No Assessment of Violence: None Noted Violent Behavior Description:  (currently calm and cooperative ) Does patient have access to weapons?: No Criminal Charges Pending?: No Does patient have a court date: No Court Date:  (n/a) Is patient on  probation?: No  Psychosis Hallucinations: None noted Delusions: None noted  Mental Status Report Appearance/Hygiene: In scrubs Eye Contact: Good Motor Activity: Freedom of movement Speech: Logical/coherent Level of Consciousness: Alert Mood: Depressed Affect: Appropriate to circumstance Anxiety Level: Panic Attacks Panic attack frequency:  (varies with stressors) Most recent panic attack:  (01/15/2016) Thought Processes: Relevant Judgement: Impaired Orientation: Person, Place, Time, Situation Obsessive Compulsive Thoughts/Behaviors: None  Cognitive Functioning Concentration:  Decreased Memory: Recent Intact, Remote Intact IQ: Average Insight: Poor Impulse Control: Poor Appetite: Fair Weight Loss:  (12 pounds in the past 1.5 week ) Weight Gain:  (no weight gain ) Sleep: Decreased Total Hours of Sleep:  (1-2 hs per night ) Vegetative Symptoms: None  ADLScreening [facility] Assessment Services) Patient's cognitive ability adequate to safely complete daily activities?: Yes Patient able to express need for assistance with ADLs?: Yes Independently performs ADLs?: Yes (appropriate for developmental age)  Prior Inpatient Therapy Prior Inpatient Therapy: Yes Prior Therapy Dates:  ([facility]-August 2016; [name]-2017) Prior Therapy Facilty/Provider(s):  ([facility] and [facility]) Reason for Treatment:  (substance abuse )  Prior Outpatient Therapy Prior Outpatient Therapy: Yes Prior Therapy Dates:  (current) Prior Therapy Facilty/Provider(s):  ("[facility] on [address] for suboxone treatment") Reason for Treatment:  (Suboxone ) Does patient have an ACCT team?: No Does patient have Intensive In-House Services?  : No Does patient have [facility] services? : No Does patient have P4CC services?: No  ADL Screening (condition at time of admission) Patient's cognitive ability adequate to safely complete daily activities?: Yes Is the patient deaf or have difficulty hearing?: No Does the patient have difficulty seeing, even when wearing glasses/contacts?: No Does the patient have difficulty concentrating, remembering, or making decisions?: Yes Patient able to express need for assistance with ADLs?: Yes Does the patient have difficulty dressing or bathing?: No Independently performs ADLs?: Yes (appropriate for developmental age) Does the patient have difficulty walking or climbing stairs?: No Weakness of Legs: None  Home Assistive Devices/Equipment Home Assistive Devices/Equipment: None    Abuse/Neglect Assessment (Assessment to be complete while patient is alone) Physical  Abuse: Denies Verbal Abuse: Denies Sexual Abuse: Denies Exploitation of patient/patient's resources: Denies Self-Neglect: Denies Values / Beliefs Cultural Requests During Hospitalization: None Spiritual Requests During Hospitalization: None   Advance Directives (For Healthcare) Does Patient Have a Medical Advance Directive?: No Would patient like information on creating a medical advance directive?: No - Patient declined Nutrition Screen- [facility] Patient's home diet: Regular  Additional Information 1:1 In Past 12 Months?: No CIRT Risk: No Elopement Risk: No Does patient have medical clearance?: Yes     Disposition: Patient meets criteria for the observation unit. Patient assigned to OBS #2. [facility] nursing staff made aware of disposition.  Disposition Initial Assessment Completed for this Encounter: Yes  On Site Evaluation by:   Reviewed with Physician:  Fransisca Kaufmann, NP  Melynda Ripple 01/16/2016 7:11 PM

## 2016-01-16 NOTE — ED Provider Notes (Signed)
[facility]-EMER[facility]ENCY DEPT Provider Note   CSN: [id number] Arrival date & time: 01/16/16  1008     History   Chief Complaint Chief Complaint  Patient presents with  . Suicidal  . Withdrawal    HPI Kenneth Elliott is a 40 y.o. male.  HPI Patient presents for increasing suicidal thoughts. States he's complained to his wrists or overdose on medication. He stopped taking his Suboxone about a week ago. He's had persistent vomiting and diarrhea since. He states he last drank alcohol yesterday. Normally drinks 1 pint daily. Is concerned he may be withdrawing from alcohol. Denies visual or auditory hallucinations. Past Medical History:  Diagnosis Date  . Hypertension   . Lupus   . Rheumatoid arthritis (HCC)   . Seizures (HCC)    per patient    Patient Active Problem List   Diagnosis Date Noted  . Severe episode of recurrent major depressive disorder, without psychotic features (HCC)   . Polysubstance dependence (HCC) 10/05/2015    History reviewed. No pertinent surgical history.     Home Medications    Prior to Admission medications   Medication Sig Start Date End Date Taking? Authorizing Provider  albuterol (PROVENTIL HFA;VENTOLIN HFA) 108 (90 Base) MCG/ACT inhaler Inhale 2 puffs into the lungs every 6 (six) hours as needed for wheezing or shortness of breath.   Yes Historical Provider, MD  cloNIDine (CATAPRES) 0.1 MG tablet Take 1 tablet (0.1 mg total) by mouth daily. Patient taking differently: Take 0.1 mg by mouth every 8 (eight) hours as needed (SBP >140).  01/12/16  Yes Mercedes Camprubi-Soms, PA-C  gabapentin (NEURONTIN) 800 MG tablet Take 800 mg by mouth 3 (three) times daily.   Yes Historical Provider, MD  ibuprofen (ADVIL,MOTRIN) 200 MG tablet Take 800 mg by mouth every 6 (six) hours as needed for headache (pain).   Yes Historical Provider, MD  loratadine (CLARITIN) 10 MG tablet Take 10 mg by mouth daily.   Yes Historical Provider, MD  LORazepam (ATIVAN) 1 MG tablet  Take 1 mg by mouth every 8 (eight) hours as needed for anxiety.   Yes Historical Provider, MD  nicotine (NICODERM CQ - DOSED IN MG/24 HOURS) 21 mg/24hr patch Place 21 mg onto the skin daily.   Yes Historical Provider, MD  ondansetron (ZOFRAN) 8 MG tablet Take 1 tablet (8 mg total) by mouth every 8 (eight) hours as needed for nausea or vomiting. 01/12/16  Yes Mercedes Camprubi-Soms, PA-C  sertraline (ZOLOFT) 100 MG tablet Take 100 mg by mouth daily.   Yes Historical Provider, MD  gabapentin (NEURONTIN) 400 MG capsule Take 2 capsules (800 mg total) by mouth 3 (three) times daily. Patient not taking: Reported on 01/16/2016 01/12/16   Mercedes Camprubi-Soms, PA-C    Family History No family history on file.  Social History Social History  Substance Use Topics  . Smoking status: Current Every Day Smoker    Packs/day: 1.00    Types: Cigarettes  . Smokeless tobacco: Current User  . Alcohol use Yes     Allergies   Patient has no known allergies.   Review of Systems Review of Systems  Constitutional: Negative for chills and fever.  Respiratory: Negative for shortness of breath.   Cardiovascular: Negative for chest pain.  Gastrointestinal: Positive for diarrhea, nausea and vomiting. Negative for abdominal pain and blood in stool.  Musculoskeletal: Negative for back pain, neck pain and neck stiffness.  Neurological: Positive for tremors. Negative for dizziness, weakness, light-headedness, numbness and headaches.  Psychiatric/Behavioral: Positive for  dysphoric mood, hallucinations and suicidal ideas. The patient is nervous/anxious.   All other systems reviewed and are negative.    Physical Exam Updated Vital Signs BP 170/95 (BP Location: Left Arm)   Pulse 75   Temp 98.6 F (37 C) (Oral)   Resp 19   Ht 5\' 9"  (1.753 m)   Wt 163 lb 6.4 oz (74.1 kg)   SpO2 97%   BMI 24.13 kg/m   Physical Exam  Constitutional: He is oriented to person, place, and time. He appears well-developed and  well-nourished. No distress.  HENT:  Head: Normocephalic and atraumatic.  Mouth/Throat: Oropharynx is clear and moist. No oropharyngeal exudate.  Eyes: EOM are normal. Pupils are equal, round, and reactive to light.  Neck: Normal range of motion. Neck supple.  No meningismus  Cardiovascular: Normal rate and regular rhythm.  Exam reveals no gallop and no friction rub.   No murmur heard. Pulmonary/Chest: Effort normal and breath sounds normal. No respiratory distress. He has no wheezes. He has no rales. He exhibits no tenderness.  Abdominal: Soft. Bowel sounds are normal. There is no tenderness. There is no rebound and no guarding.  Musculoskeletal: Normal range of motion. He exhibits no edema or tenderness.  No extremity swelling, erythema or warmth. Patient does have a contusion on the posterior surface of his left forearm. Distal pulses intact.  Neurological: He is alert and oriented to person, place, and time.  Patient is alert and oriented x3 with clear, goal oriented speech. Patient has 5/5 motor in all extremities. Sensation is intact to light touch. Patient has a normal gait and walks without assistance.  Skin: Skin is warm and dry. Capillary refill takes less than 2 seconds. No rash noted. No erythema.  Psychiatric:  Endorses SI with plan. No HI, no hallucinations.  Nursing note and vitals reviewed.    ED Treatments / Results  Labs (all labs ordered are listed, but only abnormal results are displayed) Labs Reviewed  COMPREHENSIVE METABOLIC PANEL - Abnormal; Notable for the following:       Result Value   Potassium 3.0 (*)    Glucose, Bld 103 (*)    All other components within normal limits  ACETAMINOPHEN LEVEL - Abnormal; Notable for the following:    Acetaminophen (Tylenol), Serum <10 (*)    All other components within normal limits  CBC - Abnormal; Notable for the following:    WBC 10.9 (*)    All other components within normal limits  ETHANOL  SALICYLATE LEVEL  RAPID  URINE DRUG SCREEN, HOSP PERFORMED    EKG  EKG Interpretation None       Radiology No results found.  Procedures Procedures (including critical care time)  Medications Ordered in ED Medications  LORazepam (ATIVAN) tablet 0-4 mg (not administered)    Followed by  LORazepam (ATIVAN) tablet 0-4 mg (not administered)  ondansetron (ZOFRAN) tablet 4 mg (not administered)  ibuprofen (ADVIL,MOTRIN) tablet 600 mg (not administered)  ondansetron (ZOFRAN-ODT) disintegrating tablet 4 mg (4 mg Oral Given 01/16/16 1332)  potassium chloride SA (K-DUR,KLOR-CON) CR tablet 40 mEq (40 mEq Oral Given 01/16/16 1651)  LORazepam (ATIVAN) tablet 1 mg (1 mg Oral Given 01/16/16 1650)     Initial Impression / Assessment and Plan / ED Course  I have reviewed the triage vital signs and the nursing notes.  Pertinent labs & imaging results that were available during my care of the patient were reviewed by me and considered in my medical decision making (see chart for  details).  Clinical Course     Patient medically screened and cleared. Given oral potassium replacement. Will start on CIWA protocol. Moved back to psych unit to be evaluated by TTS. Sitter at bedside.  Final Clinical Impressions(s) / ED Diagnoses   Final diagnoses:  Polysubstance abuse  Suicidal ideations    New Prescriptions New Prescriptions   No medications on file     Loren Racer, MD 01/21/16 [phone]

## 2016-01-16 NOTE — ED Triage Notes (Signed)
PT is here because he is in withdrawal from cocaine, opiates and ethoh and he is suicidal.  He last drank 1 pint liquor yesterday and last used cocaine, suboxone 6 days ago.  Pt keeps vomiting, panicky, suicidal.  Pt states he has a plan to slit his wrist or overdose.

## 2016-01-16 NOTE — ED Notes (Signed)
TTS at the bedside. 

## 2016-01-16 NOTE — ED Notes (Signed)
Pt request nicotine patch. MD aware

## 2016-01-16 NOTE — ED Notes (Signed)
Patient walked over to Pod C room with steady gait. He remains calm and cooperative.

## 2016-01-16 NOTE — BH Assessment (Signed)
Monique, RN informed of pt disposition.

## 2016-01-16 NOTE — BH Assessment (Signed)
Disposition: Patient meets criteria for the observation unit. Patient assigned to OBS #2. [facility]  Charge nurse made aware of disposition. Patient to sign support paperwork and Juel Burrow will need to transport patient to [facility] (Obs unit).

## 2016-01-16 NOTE — ED Notes (Signed)
Spoke with staffing office regarding need for sitter. They are aware of the need, and reports may have a sitter at 3 pm.

## 2016-01-17 ENCOUNTER — Encounter (HOSPITAL_COMMUNITY): Payer: Self-pay | Admitting: *Deleted

## 2016-01-17 DIAGNOSIS — Z818 Family history of other mental and behavioral disorders: Secondary | ICD-10-CM

## 2016-01-17 DIAGNOSIS — F332 Major depressive disorder, recurrent severe without psychotic features: Principal | ICD-10-CM

## 2016-01-17 DIAGNOSIS — Z79899 Other long term (current) drug therapy: Secondary | ICD-10-CM

## 2016-01-17 DIAGNOSIS — F1994 Other psychoactive substance use, unspecified with psychoactive substance-induced mood disorder: Secondary | ICD-10-CM | POA: Diagnosis present

## 2016-01-17 DIAGNOSIS — R45851 Suicidal ideations: Secondary | ICD-10-CM

## 2016-01-17 LAB — BASIC METABOLIC PANEL
Anion gap: 8 (ref 5–15)
BUN: 11 mg/dL (ref 6–20)
CHLORIDE: 107 mmol/L (ref 101–111)
CO2: 27 mmol/L (ref 22–32)
Calcium: 9.3 mg/dL (ref 8.9–10.3)
Creatinine, Ser: 0.8 mg/dL (ref 0.61–1.24)
GFR calc Af Amer: >60 mL/min (ref >60)
GFR calc non Af Amer: >60 mL/min (ref >60)
GLUCOSE: 120 mg/dL — AB (ref 65–99)
POTASSIUM: 3.5 mmol/L (ref 3.5–5.1)
Sodium: 142 mmol/L (ref 135–145)

## 2016-01-17 MED ORDER — ALBUTEROL SULFATE HFA 108 (90 BASE) MCG/ACT IN AERS: 2.0000 | INHALATION_SPRAY | Freq: Four times a day (QID) | RESPIRATORY_TRACT | Status: DC | PRN

## 2016-01-17 MED ORDER — NICOTINE 21 MG/24HR TD PT24
21.0000 mg | MEDICATED_PATCH | Freq: Every day | TRANSDERMAL | Status: DC
  Administered 2016-01-17: 21 mg via TRANSDERMAL
  Filled 2016-01-17: qty 1

## 2016-01-17 MED ORDER — ALUM & MAG HYDROXIDE-SIMETH 200-200-20 MG/5ML PO SUSP: 30.0000 mL | ORAL | Status: DC | PRN

## 2016-01-17 MED ORDER — TRAZODONE HCL 50 MG PO TABS: 50.0000 mg | ORAL_TABLET | Freq: Every evening | ORAL | Status: DC | PRN

## 2016-01-17 MED ORDER — ACETAMINOPHEN 325 MG PO TABS
650.0000 mg | ORAL_TABLET | Freq: Four times a day (QID) | ORAL | Status: DC | PRN
  Administered 2016-01-17: 650 mg via ORAL
  Filled 2016-01-17: qty 2

## 2016-01-17 MED ORDER — HYDROXYZINE HCL 25 MG PO TABS
25.0000 mg | ORAL_TABLET | Freq: Four times a day (QID) | ORAL | Status: DC | PRN
  Administered 2016-01-17 (×3): 25 mg via ORAL
  Filled 2016-01-17 (×3): qty 1

## 2016-01-17 MED ORDER — SERTRALINE HCL 100 MG PO TABS
100.0000 mg | ORAL_TABLET | Freq: Every day | ORAL | Status: DC
  Administered 2016-01-17: 100 mg via ORAL
  Filled 2016-01-17: qty 1
  Filled 2016-01-17: qty 7

## 2016-01-17 MED ORDER — LORATADINE 10 MG PO TABS: 10.0000 mg | ORAL_TABLET | Freq: Every day | ORAL | Status: DC

## 2016-01-17 MED ORDER — ONDANSETRON HCL 4 MG PO TABS
8.0000 mg | ORAL_TABLET | Freq: Three times a day (TID) | ORAL | Status: DC | PRN
  Administered 2016-01-17 (×2): 8 mg via ORAL
  Filled 2016-01-17 (×2): qty 2

## 2016-01-17 MED ORDER — NICOTINE 21 MG/24HR TD PT24: 21.0000 mg | MEDICATED_PATCH | Freq: Every day | TRANSDERMAL | 0 refills | Status: DC

## 2016-01-17 MED ORDER — CLONIDINE HCL 0.1 MG PO TABS
0.1000 mg | ORAL_TABLET | Freq: Two times a day (BID) | ORAL | Status: DC
  Administered 2016-01-17 (×2): 0.1 mg via ORAL
  Filled 2016-01-17: qty 1
  Filled 2016-01-17: qty 14
  Filled 2016-01-17: qty 1

## 2016-01-17 MED ORDER — CLONIDINE HCL 0.1 MG PO TABS: 0.1000 mg | ORAL_TABLET | Freq: Every day | ORAL | Status: DC

## 2016-01-17 MED ORDER — MAGNESIUM HYDROXIDE 400 MG/5ML PO SUSP: 30.0000 mL | Freq: Every day | ORAL | Status: DC | PRN

## 2016-01-17 MED ORDER — GABAPENTIN 800 MG PO TABS
800.0000 mg | ORAL_TABLET | Freq: Three times a day (TID) | ORAL | Status: DC
  Administered 2016-01-17 (×3): 800 mg via ORAL
  Filled 2016-01-17: qty 1
  Filled 2016-01-17: qty 21
  Filled 2016-01-17 (×2): qty 1

## 2016-01-17 MED ORDER — CLONIDINE HCL 0.1 MG PO TABS: 0.1000 mg | ORAL_TABLET | Freq: Two times a day (BID) | ORAL | 0 refills | Status: DC

## 2016-01-17 MED ORDER — SERTRALINE HCL 100 MG PO TABS: 100.0000 mg | ORAL_TABLET | Freq: Every day | ORAL | Status: DC

## 2016-01-17 MED ORDER — LORATADINE 10 MG PO TABS
10.0000 mg | ORAL_TABLET | Freq: Every day | ORAL | Status: DC
  Administered 2016-01-17: 10 mg via ORAL
  Filled 2016-01-17: qty 7
  Filled 2016-01-17: qty 1

## 2016-01-17 MED ORDER — GABAPENTIN 800 MG PO TABS: 800.0000 mg | ORAL_TABLET | Freq: Three times a day (TID) | ORAL | 0 refills | Status: DC

## 2016-01-17 NOTE — Progress Notes (Signed)
[name] INPATIENT:  Family/Significant Other Suicide Prevention Education  Suicide Prevention Education:  Patient Refusal for Family/Significant Other Suicide Prevention Education: The patient Kenneth Elliott has refused to provide written consent for family/significant other to be provided Family/Significant Other Suicide Prevention Education during admission and/or prior to discharge.  Physician notified.  Glenice Laine B 01/17/2016, 1:48 AM

## 2016-01-17 NOTE — Progress Notes (Signed)
This Clinical research associate spoke with patient about his discharge plans. Pt informed this Clinical research associate that he has an appointment at [facility] in [place], [place]; Tuesday, January 2nd 2018. Pt informed this Clinical research associate that he will have transportation to the appointment and that all he needs to do is make sure he is in attendance for the scheduled day. Pt informed this Clinical research associate that his father will be picking him up tonight around 9:00 pm.

## 2016-01-17 NOTE — Discharge Summary (Signed)
[name]-Observation Unit Discharge Summary  Admit date: 01/17/2016 Discharge date: 01/17/2016   Per initial TTS assessment note:   Kenneth Elliott an 40 y.o.male. He presents to [facility] with a complaint of depression, suicidal ideations, and polysubstance abuse. Patient with increased depressive symptoms such as hopelessness, isolating self from others, fatigue, and loss of interest in usual pleasures. He reports decreased appetite. Patient loss 12 pounds in the past 1.5 week. He also reports lack of sleep (1-2 hours per night). Current stressors include polysubstance abuse and separation from spouse. Sts that his spouse separated from him until he agrees to "get clean"/drug free. Patient today requesting help. Sts, "If I don't get help I'm going to harm myself". He does admit to suicidal ideations with a plan to overdose or "slit wrist". He has access to medications and sharp objects. Patient is not able to contract for safety at this time. He has tried to harm self in the past by cutting self. Patient reports a increase in anxiety; daily panic attacks. Patient's last anxiety attack was last night. No HI. No legal issues. No history of aggressive or assaultive behaviors. No family history of mental health illness or substance abuse. No history of physical, emotional, and/or sexual abuse. Patient has a history of polysubstance abuse.    See today 01/17/2016 for psychiatric evaluation:  Patient reports feeling bad from withdrawal this morning. He reports daily alcohol use. Patient stopped taking suboxone about a weeks ago but denies relapsing on opiates. He also reports cocaine use but his urine drug screen and alcohol level are negative. [name] reports problems with his insurance but states "I am waiting for another part of it to come through. I have been accepted to treatment center in [place]. It is called [facility]. I am supposed to go there January 2 nd. I can stay with my  stepfather until then. I'm not really suicidal. I'm trying to get better for my wife. I was living in [place] and the drug problem is so bad there. I feel stable to leave later today and would appreciate a list of AA meetings. My step-father does not drink. I am having some mild withdrawal symptoms such as anxiety, agitation, and nausea. I ran out of my medications a few days ago and did not realize that could make me feel worse. I feel some better since being back on them." Patient reports that his step-father can pick him up late this evening. He was observed making telephone calls to family after assessment. Patient was pleasant and cooperative during the assessment. Patient was provided with prescriptions for his medications along with sample supply. He appeared motivated to not use drugs while waiting for entrance to the treatment center next year. Adhvik reported that the family environment that he would be returning to was free of drug use. Patient left [name] in stable condition with all belongings returned to him. He denied any suicidal ideation prior to his departure.

## 2016-01-17 NOTE — Progress Notes (Signed)
Pt informed this Clinical research associate that his sister will be coming to pick him up around 4:30 pm today, 01/17/16. Pt asked if this Clinical research associate could inform the Nurse Practitioner, Fransisca Kaufmann. This Clinical research associate informed pt that she will inform the NP of the pt's new discharge time.

## 2016-01-17 NOTE — Progress Notes (Signed)
Pt had uneventful day. Discussed discharge and desire to get into a treatment program in [place]. Also verbalize feelings of missing his wife in [place] and being lonely. Therapeutic support provided as well as teaching regarding coping skills and abstinence from drug use. Pt verbalized understanding. Denies SI/HI/AVS and was able to contract for safety. Sleeping between intervals during shift.

## 2016-01-17 NOTE — H&P (Signed)
[facility] Provider Admission PAA/H&P  Patient Identification: Kenneth Elliott MRN:  [id number] Date of Evaluation:  01/17/2016 Chief Complaint:  Patient states "I'm trying to get off drugs but I ran out of my medications and was all alone."  Principal Diagnosis: Substance induced mood disorder ([place]) Diagnosis:   Patient Active Problem List   Diagnosis Date Noted  . Substance induced mood disorder ([place]) [F19.94] 01/17/2016  . Severe episode of recurrent major depressive disorder, without psychotic features ([place]) [F33.2]   . Polysubstance dependence ([place]) [F19.20] 10/05/2015   History of Present Illness:  Per initial TTS assessment note:   Kenneth Elliott is an 40 y.o. male. He presents to [facility] with a complaint of depression, suicidal ideations, and polysubstance abuse. Patient with increased depressive symptoms such as hopelessness, isolating self from others, fatigue, and loss of interest in usual pleasures. He reports decreased appetite. Patient loss 12 pounds in the past 1.5 week. He also reports lack of sleep (1-2 hours per night). Current stressors include polysubstance abuse and separation from spouse. Sts that his spouse separated from him until he agrees to "get clean"/drug free. Patient today requesting help. Sts, "If I don't get help I'm going to harm myself". He does admit to suicidal ideations with a plan to overdose or "slit wrist". He has access to medications and sharp objects. Patient is not able to contract for safety at this time. He has tried to harm self in the past by cutting self. Patient reports a increase in anxiety; daily panic attacks. Patient's last anxiety attack was last night. No HI. No legal issues. No history of aggressive or assaultive behaviors. No family history of mental health illness or substance abuse. No history of physical, emotional, and/or sexual abuse. Patient has a history of polysubstance abuse.    See today 01/17/2016 for psychiatric  evaluation:  Patient reports feeling bad from withdrawal this morning. He reports daily alcohol use. Patient stopped taking suboxone about a weeks ago but denies relapsing on opiates. He also reports cocaine use but his urine drug screen and alcohol level are negative. Kenneth Elliott reports problems with his insurance but states "I am waiting for another part of it to come through. I have been accepted to treatment center in [place]. It is called [facility]. I am supposed to go there January 2 nd. I can stay with my stepfather until then. I'm not really suicidal. I'm trying to get better for my wife. I was living in [place] and the drug problem is so bad there. I feel stable to leave later today and would appreciate a list of [place] meetings. My step-father does not drink. I am having some mild withdrawal symptoms such as anxiety, agitation, and nausea. I ran out of my medications a few days ago and did not realize that could make me feel worse. I feel some better since being back on them." Patient reports that his step-father can pick him up late this evening. He was observed making telephone calls to family after assessment. Patient was pleasant and cooperative during the assessment.   Associated Signs/Symptoms: Depression Symptoms:  depressed mood, anhedonia, difficulty concentrating, hopelessness, recurrent thoughts of death, suicidal thoughts without plan, anxiety, panic attacks, disturbed sleep, (Hypo) Manic Symptoms:  Denies Anxiety Symptoms:  Excessive Worry, Psychotic Symptoms:  Denies PTSD Symptoms: NA Total Time spent with patient: 30 minutes  Past Psychiatric History: GAD, Polysubstance abuse   Is the patient at risk to self? No.  Has the patient been  a risk to self in the past 6 months? No.  Has the patient been a risk to self within the distant past? Yes.    Is the patient a risk to others? No.  Has the patient been a risk to others in the past 6 months? No.  Has  the patient been a risk to others within the distant past? No.   Prior Inpatient Therapy:  [place] earlier this year Prior Outpatient Therapy:  Seen by [facility] in [place]   Alcohol Screening:   Substance Abuse History in the last 12 months:  Yes.   Consequences of Substance Abuse: Withdrawal Symptoms:   Diaphoresis anxiety Previous Psychotropic Medications: Yes Neurontin, Zoloft  Psychological Evaluations: No  Past Medical History:  Past Medical History:  Diagnosis Date  . Hypertension   . Lupus   . Rheumatoid arthritis ([place])   . Seizures ([place])    per patient   History reviewed. No pertinent surgical history. Family History: History reviewed. No pertinent family history. Family Psychiatric History: Reports father with alcohol abuse and schizophrenia  Tobacco Screening:   Social History:  History  Alcohol Use  . Yes     History  Drug Use  . Types: Cocaine, Marijuana, Methamphetamines    Comment: suboxone    Additional Social History:                           Allergies:  No Known Allergies Lab Results:  Results for orders placed or performed during the hospital encounter of 01/16/16 (from the past 48 hour(s))  Basic metabolic panel     Status: Abnormal   Collection Time: 01/17/16  7:35 AM  Result Value Ref Range   Sodium 142 135 - 145 mmol/L   Potassium 3.5 3.5 - 5.1 mmol/L   Chloride 107 101 - 111 mmol/L   CO2 27 22 - 32 mmol/L   Glucose, Bld 120 (H) 65 - 99 mg/dL   BUN 11 6 - 20 mg/dL   Creatinine, Ser 0.80 0.61 - 1.24 mg/dL   Calcium 9.3 8.9 - 10.3 mg/dL   GFR calc non Af Amer >60 >60 mL/min   GFR calc Af Amer >60 >60 mL/min    Comment: (NOTE) The eGFR has been calculated using the CKD EPI equation. This calculation has not been validated in all clinical situations. eGFR's persistently <60 mL/min signify possible Chronic Kidney Disease.    Anion gap 8 5 - 15    Comment: Performed at [facility]    Blood Alcohol level:   Lab Results  Component Value Date   [facility] <5 01/16/2016   ETH <5 85/27/7824    Metabolic Disorder Labs:  No results found for: HGBA1C, MPG No results found for: PROLACTIN No results found for: CHOL, TRIG, HDL, CHOLHDL, VLDL, LDLCALC  Current Medications: Current Facility-Administered Medications  Medication Dose Route Frequency Provider Last Rate Last Dose  . acetaminophen (TYLENOL) tablet 650 mg  650 mg Oral Q6H PRN Laverle Hobby, PA-C   650 mg at 01/17/16 0858  . albuterol (PROVENTIL HFA;VENTOLIN HFA) 108 (90 Base) MCG/ACT inhaler 2 puff  2 puff Inhalation Q6H PRN Laverle Hobby, PA-C      . alum & mag hydroxide-simeth (MAALOX/MYLANTA) 200-200-20 MG/5ML suspension 30 mL  30 mL Oral Q4H PRN Laverle Hobby, PA-C      . cloNIDine (CATAPRES) tablet 0.1 mg  0.1 mg Oral BID Laverle Hobby, PA-C   0.1  mg at 01/17/16 0721  . gabapentin (NEURONTIN) tablet 800 mg  800 mg Oral TID Laverle Hobby, PA-C   800 mg at 01/17/16 7262  . hydrOXYzine (ATARAX/VISTARIL) tablet 25 mg  25 mg Oral Q6H PRN Laverle Hobby, PA-C   25 mg at 01/17/16 0858  . loratadine (CLARITIN) tablet 10 mg  10 mg Oral Daily Laverle Hobby, PA-C   10 mg at 01/17/16 0355  . magnesium hydroxide (MILK OF MAGNESIA) suspension 30 mL  30 mL Oral Daily PRN Laverle Hobby, PA-C      . nicotine (NICODERM CQ - dosed in mg/24 hours) patch 21 mg  21 mg Transdermal Daily Laverle Hobby, PA-C   21 mg at 01/17/16 0721  . ondansetron (ZOFRAN) tablet 8 mg  8 mg Oral Q8H PRN Laverle Hobby, PA-C   8 mg at 01/17/16 0245  . sertraline (ZOLOFT) tablet 100 mg  100 mg Oral Daily Laverle Hobby, PA-C   100 mg at 01/17/16 9741  . traZODone (DESYREL) tablet 50 mg  50 mg Oral QHS,MR X 1 Spencer E Simon, PA-C       PTA Medications: Prescriptions Prior to Admission  Medication Sig Dispense Refill Last Dose  . albuterol (PROVENTIL HFA;VENTOLIN HFA) 108 (90 Base) MCG/ACT inhaler Inhale 2 puffs into the lungs every 6 (six) hours as needed for  wheezing or shortness of breath.   week ago  . cloNIDine (CATAPRES) 0.1 MG tablet Take 1 tablet (0.1 mg total) by mouth daily. (Patient taking differently: Take 0.1 mg by mouth every 8 (eight) hours as needed (SBP >140). ) 15 tablet 0 01/13/2016  . gabapentin (NEURONTIN) 800 MG tablet Take 800 mg by mouth 3 (three) times daily.   01/13/2016  . ibuprofen (ADVIL,MOTRIN) 200 MG tablet Take 800 mg by mouth every 6 (six) hours as needed for headache (pain).   couple days ago  . loratadine (CLARITIN) 10 MG tablet Take 10 mg by mouth daily.   01/13/2016  . LORazepam (ATIVAN) 1 MG tablet Take 1 mg by mouth every 8 (eight) hours as needed for anxiety.   01/13/2016  . nicotine (NICODERM CQ - DOSED IN MG/24 HOURS) 21 mg/24hr patch Place 21 mg onto the skin daily.   01/14/2016  . ondansetron (ZOFRAN) 8 MG tablet Take 1 tablet (8 mg total) by mouth every 8 (eight) hours as needed for nausea or vomiting. 10 tablet 0 01/16/2016 at am  . sertraline (ZOLOFT) 100 MG tablet Take 100 mg by mouth daily.   01/13/2016  . gabapentin (NEURONTIN) 400 MG capsule Take 2 capsules (800 mg total) by mouth 3 (three) times daily. (Patient not taking: Reported on 01/17/2016) 60 capsule 0 Not Taking at Unknown time    Musculoskeletal: Strength & Muscle Tone: within normal limits Gait & Station: normal Patient leans: N/A  Psychiatric Specialty Exam: Physical Exam  Review of Systems  Psychiatric/Behavioral: Positive for depression and substance abuse. Negative for hallucinations, memory loss and suicidal ideas. The patient is nervous/anxious. The patient does not have insomnia.     Blood pressure 131/82, pulse 77, temperature 98.6 F (37 C), temperature source Oral, resp. rate 16, height '5\' 9"'  (1.753 m), weight 73.9 kg (163 lb), SpO2 99 %.Body mass index is 24.07 kg/m.  General Appearance: Casual  Eye Contact:  Fair  Speech:  Clear and Coherent  Volume:  Normal  Mood:  Anxious  Affect:  Constricted  Thought Process:  Coherent  and Goal Directed  Orientation:  Full (Time, Place, and Person)  Thought Content:  Symptoms, worries, concerns  Suicidal Thoughts:  Yes.  without intent/plan reports mainly due to frustration from drug abuse but is more motivated to obtain help than hurt himself   Homicidal Thoughts:  No  Memory:  Immediate;   Good Recent;   Good Remote;   Good  Judgement:  Poor  Insight:  Present  Psychomotor Activity:  Normal  Concentration:  Concentration: Good and Attention Span: Good  Recall:  Good  Fund of Knowledge:  Good  Language:  Good  Akathisia:  No  Handed:  Right  AIMS (if indicated):     Assets:  Communication Skills Desire for Improvement Housing Intimacy Leisure Time Physical Health Resilience Social Support  ADL's:  Intact  Cognition:  WNL  Sleep:         Treatment Plan Summary: Daily contact with patient to assess and evaluate symptoms and progress in treatment and Medication management  Observation Level/Precautions:  Continuous Observation Laboratory:  CBC Chemistry Profile UDS Psychotherapy:  Individual for substance abuse counseling  Medications:  Neurontin 800 mg TID for anxiety, Clonidine 0.1 mg BID for Hypertension, Zoloft 100 mg daily for depression, Trazodone 50 mg hs for insomnia.  Consultations:  None Discharge Concerns:  Continued substance abuse  Estimated LOS: 24 hours  Other:      Elmarie Shiley, NP 12/12/20179:08 AM

## 2016-01-17 NOTE — Progress Notes (Signed)
Admission Note:  D: Patient is a 40 year old male admitted in Obs. Unit from [facility] with a complaint of depression, polysubstance abuse and suicidal ideation. On admission, patient is restless, pacing the search room stated "I can't sit still". Patient appeared anxious. Endorses depression, insomnia and poor appetite Patient stated " I need help to get clean. I plan to go to [place] ??? for a long term treatment like 2 years".  Patient denies SI/HI, AH/VH at this time.   A: Skin/body search done.  No contraband found. Tattoos noted all over the body. Patient reports 48 tattoos in his body. Bruise noted at the right hand (IV DRUGS) and on the left hand (Site of blood draw).  POC and unit policies explained and understanding verbalized. Consents obtained. Accepted food and fluids offered.     R: Patient had no additional questions or concerns.   Marland Kitchen

## 2017-11-27 ENCOUNTER — Emergency Department (HOSPITAL_COMMUNITY)
Admission: EM | Admit: 2017-11-27 | Discharge: 2017-11-28 | Disposition: A | Discharge status: Home / Self Care | Payer: Self-pay | Attending: Emergency Medicine | Admitting: Emergency Medicine

## 2017-11-27 ENCOUNTER — Encounter (HOSPITAL_COMMUNITY): Payer: Self-pay | Admitting: Emergency Medicine

## 2017-11-27 DIAGNOSIS — Z046 Encounter for general psychiatric examination, requested by authority: Secondary | ICD-10-CM | POA: Insufficient documentation

## 2017-11-27 DIAGNOSIS — F1994 Other psychoactive substance use, unspecified with psychoactive substance-induced mood disorder: Secondary | ICD-10-CM

## 2017-11-27 DIAGNOSIS — F4325 Adjustment disorder with mixed disturbance of emotions and conduct: Secondary | ICD-10-CM | POA: Diagnosis present

## 2017-11-27 DIAGNOSIS — I10 Essential (primary) hypertension: Secondary | ICD-10-CM | POA: Insufficient documentation

## 2017-11-27 DIAGNOSIS — R45851 Suicidal ideations: Secondary | ICD-10-CM | POA: Insufficient documentation

## 2017-11-27 DIAGNOSIS — F314 Bipolar disorder, current episode depressed, severe, without psychotic features: Secondary | ICD-10-CM | POA: Insufficient documentation

## 2017-11-27 LAB — COMPREHENSIVE METABOLIC PANEL
ALBUMIN: 4.3 g/dL (ref 3.5–5.0)
ALK PHOS: 79 U/L (ref 38–126)
ALT: 243 U/L — AB (ref 0–44)
AST: 158 U/L — ABNORMAL HIGH (ref 15–41)
Anion gap: 8 (ref 5–15)
BUN: 10 mg/dL (ref 6–20)
CO2: 24 mmol/L (ref 22–32)
CREATININE: 1.02 mg/dL (ref 0.61–1.24)
Calcium: 8.8 mg/dL — ABNORMAL LOW (ref 8.9–10.3)
Chloride: 109 mmol/L (ref 98–111)
GFR calc Af Amer: >60 mL/min (ref >60)
GFR calc non Af Amer: >60 mL/min (ref >60)
GLUCOSE: 99 mg/dL (ref 70–99)
Potassium: 3.7 mmol/L (ref 3.5–5.1)
SODIUM: 141 mmol/L (ref 135–145)
Total Bilirubin: 0.5 mg/dL (ref 0.3–1.2)
Total Protein: 8.1 g/dL (ref 6.5–8.1)

## 2017-11-27 LAB — CBC WITH DIFFERENTIAL/PLATELET
Abs Immature Granulocytes: 0.02 10*3/uL (ref 0.00–0.07)
BASOS ABS: 0 10*3/uL (ref 0.0–0.1)
Basophils Relative: 1 %
EOS ABS: 0.1 10*3/uL (ref 0.0–0.5)
EOS PCT: 3 %
HCT: 42.9 % (ref 39.0–52.0)
Hemoglobin: 13.7 g/dL (ref 13.0–17.0)
IMMATURE GRANULOCYTES: 0 %
LYMPHS PCT: 30 %
Lymphs Abs: 1.5 10*3/uL (ref 0.7–4.0)
MCH: 29.3 pg (ref 26.0–34.0)
MCHC: 31.9 g/dL (ref 30.0–36.0)
MCV: 91.7 fL (ref 80.0–100.0)
Monocytes Absolute: 0.4 10*3/uL (ref 0.1–1.0)
Monocytes Relative: 8 %
NEUTROS PCT: 58 %
NRBC: 0 % (ref 0.0–0.2)
Neutro Abs: 3 10*3/uL (ref 1.7–7.7)
Platelets: 204 10*3/uL (ref 150–400)
RBC: 4.68 MIL/uL (ref 4.22–5.81)
RDW: 13.9 % (ref 11.5–15.5)
WBC: 5.2 10*3/uL (ref 4.0–10.5)

## 2017-11-27 LAB — RAPID URINE DRUG SCREEN, HOSP PERFORMED
Amphetamines: NOT DETECTED
Barbiturates: NOT DETECTED
Benzodiazepines: NOT DETECTED
Cocaine: NOT DETECTED
Opiates: NOT DETECTED
TETRAHYDROCANNABINOL: NOT DETECTED

## 2017-11-27 LAB — SALICYLATE LEVEL

## 2017-11-27 LAB — ACETAMINOPHEN LEVEL: Acetaminophen (Tylenol), Serum: <10 ug/mL — ABNORMAL LOW (ref 10–30)

## 2017-11-27 LAB — ETHANOL: Alcohol, Ethyl (B): 137 mg/dL — ABNORMAL HIGH (ref <10)

## 2017-11-27 MED ORDER — LORAZEPAM 2 MG/ML IJ SOLN: 0.0000 mg | Freq: Two times a day (BID) | INTRAMUSCULAR | Status: DC

## 2017-11-27 MED ORDER — NICOTINE 21 MG/24HR TD PT24
21.0000 mg | MEDICATED_PATCH | Freq: Once | TRANSDERMAL | Status: AC
  Administered 2017-11-27 – 2017-11-28 (×2): 21 mg via TRANSDERMAL
  Filled 2017-11-27: qty 1

## 2017-11-27 MED ORDER — LORAZEPAM 1 MG PO TABS
0.0000 mg | ORAL_TABLET | Freq: Four times a day (QID) | ORAL | Status: DC
  Administered 2017-11-27: 1 mg via ORAL
  Filled 2017-11-27: qty 1

## 2017-11-27 MED ORDER — LORAZEPAM 2 MG/ML IJ SOLN: 0.0000 mg | Freq: Four times a day (QID) | INTRAMUSCULAR | Status: DC

## 2017-11-27 MED ORDER — VITAMIN B-1 100 MG PO TABS
100.0000 mg | ORAL_TABLET | Freq: Every day | ORAL | Status: DC
  Administered 2017-11-27 – 2017-11-28 (×2): 100 mg via ORAL
  Filled 2017-11-27 (×2): qty 1

## 2017-11-27 MED ORDER — IBUPROFEN 800 MG PO TABS: 800.0000 mg | ORAL_TABLET | Freq: Four times a day (QID) | ORAL | Status: DC | PRN

## 2017-11-27 MED ORDER — METOPROLOL SUCCINATE ER 100 MG PO TB24
100.0000 mg | ORAL_TABLET | Freq: Every day | ORAL | Status: DC
  Administered 2017-11-27 – 2017-11-28 (×2): 100 mg via ORAL
  Filled 2017-11-27 (×2): qty 1

## 2017-11-27 MED ORDER — LORAZEPAM 1 MG PO TABS: 0.0000 mg | ORAL_TABLET | Freq: Two times a day (BID) | ORAL | Status: DC

## 2017-11-27 MED ORDER — LORATADINE 10 MG PO TABS
10.0000 mg | ORAL_TABLET | Freq: Every day | ORAL | Status: DC
  Administered 2017-11-27 – 2017-11-28 (×2): 10 mg via ORAL
  Filled 2017-11-27 (×2): qty 1

## 2017-11-27 MED ORDER — THIAMINE HCL 100 MG/ML IJ SOLN: 100.0000 mg | Freq: Every day | INTRAMUSCULAR | Status: DC

## 2017-11-27 MED ORDER — HALOPERIDOL 5 MG PO TABS
5.0000 mg | ORAL_TABLET | Freq: Once | ORAL | Status: AC
  Administered 2017-11-27: 5 mg via ORAL
  Filled 2017-11-27: qty 1

## 2017-11-27 MED ORDER — GABAPENTIN 400 MG PO CAPS
800.0000 mg | ORAL_CAPSULE | Freq: Three times a day (TID) | ORAL | Status: DC
  Administered 2017-11-27 – 2017-11-28 (×3): 800 mg via ORAL
  Filled 2017-11-27 (×3): qty 2

## 2017-11-27 MED ORDER — ALBUTEROL SULFATE HFA 108 (90 BASE) MCG/ACT IN AERS: 2.0000 | INHALATION_SPRAY | Freq: Four times a day (QID) | RESPIRATORY_TRACT | Status: DC | PRN

## 2017-11-27 NOTE — BH Assessment (Signed)
Assessment Note  Kenneth Elliott is a 42 y.o. male, in [facility] under IVC by LE due to pt stating he was suicidal with a plan to slit his wrists. Pt's BAL was 137 upon arrival.  Pt reports he has been on meds to address his bipolar symptoms up until 4 months ago due to losing his insurance. Pt reports he's been having "on and off" SI and his temper has been getting worse. Pt reports he is overwhelmed and "stressed out" and needs to get back on his meds. Pt reports that he started a new job and just got his insurance card in the mail.  Pt admits to telling LE that he was suicidal with a plan to slit his wrists, but then said that he would never do it. He afterwards said that he didn't know what he would do b/c he's been off his medicine for so long. Pt reiterated that he just needs to get back on his medications. He denies HI, AVH.   Case staffed with Assunta Found, NP. Pt is recommended to be observed overnight and reassessed in the morning by psychiatry.   Diagnosis: F31.4 Bipolar d/o, current episode depressed, severe, w/out psychotic features  Past Medical History:  Past Medical History:  Diagnosis Date  . Hypertension   . Lupus ([facility])   . Rheumatoid arthritis ([facility])   . Seizures ([facility])    per patient    History reviewed. No pertinent surgical history.  Family History: No family history on file.  Social History:  reports that he has been smoking cigarettes. He has been smoking about 1.50 packs per day. He has never used smokeless tobacco. He reports that he drinks alcohol. He reports that he has current or past drug history. Drugs: Cocaine, Marijuana, and Methamphetamines.  Additional Social History:  Alcohol / Drug Use Pain Medications: pt denies Prescriptions: pt denies Over the Counter: pt denies History of alcohol / drug use?: Yes(Pt reports being a former drug addict. Clean for over 2 years. Also reports weekend use of alcohol.)  CIWA: CIWA-Ar BP: (!) 158/104 Pulse Rate: (!)  108 COWS:    Allergies: No Known Allergies  Home Medications:  (Not in a hospital admission)  OB/GYN Status:  No LMP for male patient.  General Assessment Data [facility] of [facility]: [facility] TTS Assessment: In system Is this a Tele or Face-to-Face Assessment?: Face-to-Face Is this an Initial Assessment or a Re-assessment for this encounter?: Initial Assessment Patient Accompanied by:: N/A Language Other than English: No Living Arrangements: Other (Comment) What gender do you identify as?: Male Marital status: Divorced Living Arrangements: Alone Can pt return to current living arrangement?: Yes Admission Status: Voluntary Is patient capable of signing voluntary admission?: Yes Referral Source: Self/Family/Friend Insurance type: Pt reports [employer]     Crisis Care Plan Living Arrangements: Alone Name of Psychiatrist: none Name of Therapist: none  Education Status Is patient currently in school?: No Is the patient employed, unemployed or receiving disability?: Employed  Risk to self with the past 6 months Suicidal Ideation: No-Not Currently/Within Last 6 Months Has patient been a risk to self within the past 6 months prior to admission? : No Suicidal Intent: No-Not Currently/Within Last 6 Months Has patient had any suicidal intent within the past 6 months prior to admission? : No Is patient at risk for suicide?: Yes Suicidal Plan?: No-Not Currently/Within Last 6 Months Has patient had any suicidal plan within the past 6 months prior to admission? : Yes Access to Means: Yes  Specify Access to Suicidal Means: sharp objects Previous Attempts/Gestures: No Intentional Self Injurious Behavior: Cutting Comment - Self Injurious Behavior: pt reports a hx of cutting Family Suicide History: No Recent stressful life event(s): Other (Comment) Persecutory voices/beliefs?: No Depression: No Depression Symptoms: Feeling angry/irritable Substance abuse history and/or treatment for  substance abuse?: Yes Suicide prevention information given to non-admitted patients: Not applicable  Risk to Others within the past 6 months Homicidal Ideation: No Does patient have any lifetime risk of violence toward others beyond the six months prior to admission? : No Thoughts of Harm to Others: No Current Homicidal Intent: No Current Homicidal Plan: No Access to Homicidal Means: No History of harm to others?: No Assessment of Violence: None Noted Does patient have access to weapons?: No Criminal Charges Pending?: No Does patient have a court date: No Is patient on probation?: No  Psychosis Hallucinations: None noted  Mental Status Report Appearance/Hygiene: Unremarkable Eye Contact: Good Motor Activity: Unremarkable Speech: Logical/coherent Level of Consciousness: Alert Mood: Pleasant, Euthymic Affect: Appropriate to circumstance Anxiety Level: Minimal Thought Processes: Coherent, Relevant Judgement: Partial Orientation: Person, Place, Time, Situation Obsessive Compulsive Thoughts/Behaviors: None  Cognitive Functioning Concentration: Normal Memory: Recent Intact Is patient IDD: No Insight: Fair Impulse Control: Fair Appetite: Good Have you had any weight changes? : No Change Sleep: No Change Total Hours of Sleep: 8 Vegetative Symptoms: None  ADLScreening [facility] Assessment Services) Patient's cognitive ability adequate to safely complete daily activities?: Yes Patient able to express need for assistance with ADLs?: Yes Independently performs ADLs?: Yes (appropriate for developmental age)  Prior Inpatient Therapy Prior Inpatient Therapy: Yes Prior Therapy Dates: hospitalized twice-last time being a couple of years ago Prior Therapy Facilty/Provider(s): [facility] Reason for Treatment: substance abuse; depression  Prior Outpatient Therapy Prior Outpatient Therapy: Yes Reason for Treatment: substance abuse; bipolar Does patient have an ACCT team?: No Does patient  have Intensive In-House Services?  : No Does patient have [facility] services? : No Does patient have P4CC services?: No  ADL Screening (condition at time of admission) Patient's cognitive ability adequate to safely complete daily activities?: Yes Is the patient deaf or have difficulty hearing?: No Does the patient have difficulty seeing, even when wearing glasses/contacts?: No Does the patient have difficulty concentrating, remembering, or making decisions?: No Patient able to express need for assistance with ADLs?: Yes Does the patient have difficulty dressing or bathing?: No Independently performs ADLs?: Yes (appropriate for developmental age) Does the patient have difficulty walking or climbing stairs?: No Weakness of Legs: None Weakness of Arms/Hands: None  Home Assistive Devices/Equipment Home Assistive Devices/Equipment: None    Abuse/Neglect Assessment (Assessment to be complete while patient is alone) Abuse/Neglect Assessment Can Be Completed: Yes Physical Abuse: Denies Verbal Abuse: Denies Sexual Abuse: Denies Exploitation of patient/patient's resources: Denies Self-Neglect: Denies     Merchant [employer] officer (For Healthcare) Does Patient Have a Medical Advance Directive?: No          Disposition:  Disposition Initial Assessment Completed for this Encounter: Yes  On Site Evaluation by:   Reviewed with Physician:    Laddie Aquas 11/27/2017 2:36 PM

## 2017-11-27 NOTE — ED Notes (Signed)
Patient is resting comfortably. 

## 2017-11-27 NOTE — ED Notes (Signed)
IVC paperwork has been placed in orange folder at the nursing station.

## 2017-11-27 NOTE — ED Notes (Signed)
Bed: WA27 Expected date:  Expected time:  Means of arrival:  Comments: 

## 2017-11-27 NOTE — ED Triage Notes (Addendum)
Patient here via [facility] from girlfriends house. Suicidal with plan to cut self. Reports depression. Today girlfriend kicked him out the home. Patient also states that he drinks everyday.

## 2017-11-27 NOTE — ED Provider Notes (Addendum)
[facility]-EMERGENCY DEPT Provider Note   CSN: [id number] Arrival date & time: 11/27/17  1222     History   Chief Complaint Chief Complaint  Patient presents with  . Suicidal  . IVC    HPI Kenneth Elliott is a 42 y.o. male.  The history is provided by the patient.  Mental Health Problem  Presenting symptoms: Elliott, suicidal thoughts and suicidal threats   Presenting symptoms: no hallucinations and no suicide attempt   Patient accompanied by:  Law enforcement Degree of incapacity (severity):  Severe Timing:  Constant Progression:  Worsening Chronicity:  Recurrent Context: alcohol use and noncompliance   Treatment compliance:  Untreated Relieved by:  Nothing Worsened by:  Nothing Associated symptoms: anxiety, feelings of worthlessness, irritability and poor judgment   Associated symptoms: no abdominal pain and no chest pain   Risk factors: hx of mental illness     Past Medical History:  Diagnosis Date  . Hypertension   . Lupus (HCC)   . Rheumatoid arthritis (HCC)   . Seizures (HCC)    per patient    Patient Active Problem List   Diagnosis Date Noted  . Substance induced mood disorder (HCC) 01/17/2016  . Severe episode of recurrent major depressive disorder, without psychotic features (HCC)   . Polysubstance dependence (HCC) 10/05/2015    History reviewed. No pertinent surgical history.      Home Medications    Prior to Admission medications   Medication Sig Start Date End Date Taking? Authorizing Provider  albuterol (PROVENTIL HFA;VENTOLIN HFA) 108 (90 Base) MCG/ACT inhaler Inhale 2 puffs into the lungs every 6 (six) hours as needed for wheezing or shortness of breath.   Yes [provider]  clonazePAM (KLONOPIN) 1 MG tablet Take 1 mg by mouth 3 (three) times daily as needed for anxiety.   Yes [provider]  cloNIDine (CATAPRES) 0.1 MG tablet Take 1 tablet (0.1 mg total) by mouth 2 (two) times daily.  01/17/16  Yes Thermon Leyland, NP  FLUoxetine (PROZAC) 40 MG capsule Take 40 mg by mouth daily.   Yes [provider]  gabapentin (NEURONTIN) 800 MG tablet Take 1 tablet (800 mg total) by mouth 3 (three) times daily. 01/17/16  Yes Thermon Leyland, NP  ibuprofen (ADVIL,MOTRIN) 200 MG tablet Take 800 mg by mouth every 6 (six) hours as needed for headache (pain).   Yes [provider]  loratadine (CLARITIN) 10 MG tablet Take 1 tablet (10 mg total) by mouth daily. 01/17/16  Yes Thermon Leyland, NP  metoprolol succinate (TOPROL-XL) 100 MG 24 hr tablet Take 100 mg by mouth daily. Take with or immediately following a meal.   Yes [provider]  nicotine (NICODERM CQ - DOSED IN MG/24 HOURS) 21 mg/24hr patch Place 1 patch (21 mg total) onto the skin daily. 01/17/16  Yes Thermon Leyland, NP  ondansetron (ZOFRAN) 8 MG tablet Take 1 tablet (8 mg total) by mouth every 8 (eight) hours as needed for nausea or vomiting. 01/12/16  Yes Street, [place], PA-C  sertraline (ZOLOFT) 100 MG tablet Take 1 tablet (100 mg total) by mouth daily. 01/18/16  Yes Thermon Leyland, NP    Family History No family history on file.  Social History Social History   Tobacco Use  . Smoking status: Current Every Day Smoker    Packs/day: 1.50    Types: Cigarettes  . Smokeless tobacco: Never Used  Substance Use Topics  . Alcohol use: Yes  .  Drug use: Yes    Types: Cocaine, Marijuana, Methamphetamines    Comment: suboxone     Allergies   Patient has no known allergies.   Review of Systems Review of Systems  Constitutional: Positive for irritability. Negative for chills and fever.  HENT: Negative for ear pain and sore throat.   Eyes: Negative for pain and visual disturbance.  Respiratory: Negative for cough and shortness of breath.   Cardiovascular: Negative for chest pain and palpitations.  Gastrointestinal: Negative for abdominal pain and vomiting.  Genitourinary: Negative for dysuria and  hematuria.  Musculoskeletal: Negative for arthralgias and back pain.  Skin: Negative for color change and rash.  Neurological: Negative for seizures and syncope.  Psychiatric/Behavioral: Positive for self-injury and suicidal ideas. Negative for hallucinations. The patient is nervous/anxious.   All other systems reviewed and are negative.    Physical Exam Updated Vital Signs  ED Triage Vitals  Enc Vitals Group     BP 11/27/17 1240 (!) 158/104     Pulse Rate 11/27/17 1240 (!) 108     Resp 11/27/17 1240 18     Temp 11/27/17 1240 98 F (36.7 C)     Temp Source 11/27/17 1240 Oral     SpO2 11/27/17 1240 96 %     Weight --      Height --      Head Circumference --      Peak Flow --      Pain Score 11/27/17 1242 0     Pain Loc --      Pain Edu? --      Excl. in GC? --     Physical Exam  Constitutional: He is oriented to person, place, and time. He appears well-developed and well-nourished.  HENT:  Head: Normocephalic and atraumatic.  Eyes: Pupils are equal, round, and reactive to light. Conjunctivae and EOM are normal.  Neck: Normal range of motion. Neck supple.  Cardiovascular: Normal rate, regular rhythm, normal heart sounds and intact distal pulses.  No murmur heard. Pulmonary/Chest: Effort normal and breath sounds normal. No respiratory distress.  Abdominal: Soft. There is no tenderness.  Musculoskeletal: Normal range of motion. He exhibits no edema.  Neurological: He is alert and oriented to person, place, and time.  Skin: Skin is warm and dry. Capillary refill takes less than 2 seconds.  Psychiatric: His mood appears anxious. His affect is labile. His speech is rapid and/or pressured. He is agitated. He expresses impulsivity. He expresses suicidal ideation. He expresses no homicidal ideation. He expresses suicidal plans. He expresses no homicidal plans.  Nursing note and vitals reviewed.    ED Treatments / Results  Labs (all labs ordered are listed, but only abnormal  results are displayed) Labs Reviewed  COMPREHENSIVE METABOLIC PANEL - Abnormal; Notable for the following components:      Result Value   Calcium 8.8 (*)    AST 158 (*)    ALT 243 (*)    All other components within normal limits  ETHANOL - Abnormal; Notable for the following components:   Alcohol, Ethyl (B) 137 (*)    All other components within normal limits  ACETAMINOPHEN LEVEL - Abnormal; Notable for the following components:   Acetaminophen (Tylenol), Serum <10 (*)    All other components within normal limits  CBC WITH DIFFERENTIAL/PLATELET  SALICYLATE LEVEL  RAPID URINE DRUG SCREEN, HOSP PERFORMED    EKG None  Radiology No results found.  Procedures Procedures (including critical care time)  Medications Ordered in ED  Medications  LORazepam (ATIVAN) injection 0-4 mg ( Intravenous See Alternative 11/27/17 1359)    Or  LORazepam (ATIVAN) tablet 0-4 mg (1 mg Oral Given 11/27/17 1359)  LORazepam (ATIVAN) injection 0-4 mg (has no administration in time range)    Or  LORazepam (ATIVAN) tablet 0-4 mg (has no administration in time range)  thiamine (VITAMIN B-1) tablet 100 mg (100 mg Oral Given 11/27/17 1359)    Or  thiamine (B-1) injection 100 mg ( Intravenous See Alternative 11/27/17 1359)  albuterol (PROVENTIL HFA;VENTOLIN HFA) 108 (90 Base) MCG/ACT inhaler 2 puff (has no administration in time range)  gabapentin (NEURONTIN) capsule 800 mg (has no administration in time range)  ibuprofen (ADVIL,MOTRIN) tablet 800 mg (has no administration in time range)  loratadine (CLARITIN) tablet 10 mg (10 mg Oral Given 11/27/17 1452)  metoprolol succinate (TOPROL-XL) 24 hr tablet 100 mg (100 mg Oral Given 11/27/17 1451)  haloperidol (HALDOL) tablet 5 mg (5 mg Oral Given 11/27/17 1359)     Initial Impression / Assessment and Plan / ED Course  I have reviewed the triage vital signs and the nursing notes.  Pertinent labs & imaging results that were available during my care of the  patient were reviewed by me and considered in my medical decision making (see chart for details).     EZZARD DITMER is a 42 year old male with history of depression who presents to the ED with suicidal ideation.  Patient unremarkable vitals.  No fever.  Patient with suicidal ideation over the last couple days.  Has been performing self-harm with cutting of his right lower leg.  States that he wants to hurt himself.  States that he will kill himself by cutting his throat or his wrists.  Patient states that he has been using alcohol.  Does not particularly stress why he is suicidal.  He states that he has not been compliant with his medications and is feeling extremely depressed.  Overall the patient physically appears well.  Has some abrasions to his right lower leg of different ages.  Patient with otherwise unremarkable exam.  He does exhibit suicidal ideation.  Medical clearance labs were ordered that were overall unremarkable.  Does have mild elevation of alcohol.  He does not appear clinically intoxicated.  Patient was given Haldol for agitation p.o., IVC was filled out for suicidal ideation.  Has elevated LFTs, likely from ETOH, will need to f/u outpatient, written in discharge paperwork.  Behavioral health assessed the patient and will re-evaluate them in the morning for further need for care.  Patient patient with home medications ordered and awaiting final psychiatric disposition.  Final Clinical Impressions(s) / ED Diagnoses   Final diagnoses:  Suicidal ideation    ED Discharge Orders    None       Virgina Norfolk, DO 11/27/17 1513    Virgina Norfolk, DO 11/27/17 1514

## 2017-11-27 NOTE — ED Notes (Signed)
Pt is calm and cooperative on the Acute Unit. Denies SI/HI/AVH.

## 2017-11-27 NOTE — ED Notes (Signed)
Bed: [facility] Expected date:  Expected time:  Means of arrival:  Comments: Hold for 27

## 2017-11-28 DIAGNOSIS — F4325 Adjustment disorder with mixed disturbance of emotions and conduct: Secondary | ICD-10-CM | POA: Diagnosis present

## 2017-11-28 MED ORDER — FLUOXETINE HCL 20 MG PO CAPS: 20.0000 mg | ORAL_CAPSULE | Freq: Every day | ORAL | 0 refills | Status: DC

## 2017-11-28 MED ORDER — NICOTINE 21 MG/24HR TD PT24: 21.0000 mg | MEDICATED_PATCH | Freq: Once | TRANSDERMAL | Status: DC

## 2017-11-28 MED ORDER — FLUOXETINE HCL 20 MG PO CAPS: 20.0000 mg | ORAL_CAPSULE | Freq: Every day | ORAL | Status: DC

## 2017-11-28 MED ORDER — GABAPENTIN 400 MG PO CAPS: 800.0000 mg | ORAL_CAPSULE | Freq: Three times a day (TID) | ORAL | 0 refills | Status: DC

## 2017-11-28 MED ORDER — NICOTINE 21 MG/24HR TD PT24
21.0000 mg | MEDICATED_PATCH | Freq: Once | TRANSDERMAL | Status: DC
  Filled 2017-11-28: qty 1

## 2017-11-28 NOTE — Consult Note (Signed)
[facility] Psych ED Discharge  11/28/2017 12:49 PM Kenneth Elliott  MRN:  [id number] Principal Problem: Adjustment disorder with mixed disturbance of emotions and conduct Discharge Diagnoses:  Patient Active Problem List   Diagnosis Date Noted  . Substance induced mood disorder (HCC) [F19.94] 01/17/2016  . 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 Polysubstance dependence (HCC) [F19.20] 10/05/2015    Subjective:  Kenneth Elliott denies SI, HI or AVH. He denies a history of suicide attempts. He denies access to weapons or guns. He admits to feeling depressed in the setting of multiple psychosocial stressors including work (works in Radi[employer], therapeutic) and relationship strain with his girlfriend. Per chart review, he reportedly had an argument with his girlfriend yesterday and was "kicked out" of her home. He reports that he has his own place and is not homeless. He reports feeling better today after having time to "clear his mind" overnight. He reports occasional alcohol use but denies that his use is problematic. BAL was 137 on admission. He declines substance abuse resources. He has not taken his prescribed medications for 3 months due to losing his insurance. He reports that he has new insurance. He would like to restart Prozac and Gabapentin.   Total Time spent with patient: 30 minutes  Past Psychiatric History: Depression, anxiety and alcohol abuse.   Past Medical History:  Past Medical History:  Diagnosis Date  . Hypertension   . Lupus (HCC)   . Rheumatoid arthritis (HCC)   . Seizures (HCC)    per patient   History reviewed. No pertinent surgical history. Family History: No family history on file. Family Psychiatric  History: Father-alcohol abuse and schizophrenia.   Social History:  Social History   Substance and Sexual Activity  Alcohol Use Yes    Social History   Substance and Sexual Activity  Drug Use Yes  . Types: Cocaine,  Marijuana, Methamphetamines   Comment: suboxone   Social History   Socioeconomic History  . Marital status: Divorced    Spouse name: Not on file  . Number of children: Not on file  . Years of education: Not on file  . Highest education level: Not on file  Occupational History  . Not on file  Social Needs  . Financial resource strain: Not on file  . Food insecurity:    Worry: Not on file    Inability: Not on file  . Transportation needs:    Medical: Not on file    Non-medical: Not on file  Tobacco Use  . Smoking status: Current Every Day Smoker    Packs/day: 1.50    Types: Cigarettes  . Smokeless tobacco: Never Used  Substance and Sexual Activity  . Alcohol use: Yes  . Drug use: Yes    Types: Cocaine, Marijuana, Methamphetamines    Comment: suboxone  . Sexual activity: Yes    Partners: Female  Lifestyle  . Physical activity:    Days per week: Not on file    Minutes per session: Not on file  . Stress: Not on file  Relationships  . Social connections:    Talks on phone: Not on file    Gets together: Not on file    Attends religious service: Not on file    Active member of club or organization: Not on file    Attends meetings of clubs or organizations: Not on file    Relationship status: Not on file  Other Topics Concern  . Not  on file  Social History Narrative  . Not on file    Has this patient used any form of tobacco in the last 30 days? (Cigarettes, Smokeless Tobacco, Cigars, and/or Pipes) Prescription not provided because: N/A  Current Medications: Current Facility-Administered Medications  Medication Dose Route Frequency Provider Last Rate Last Dose  . albuterol (PROVENTIL HFA;VENTOLIN HFA) 108 (90 Base) MCG/ACT inhaler 2 puff  2 puff Inhalation Q6H PRN Curatolo, Adam, DO      . FLUoxetine (PROZAC) capsule 20 mg  20 mg Oral Daily Juanetta Beets J, DO      . gabapentin (NEURONTIN) capsule 800 mg  800 mg Oral TID Curatolo, Adam, DO   800 mg at 11/28/17  1050  . ibuprofen (ADVIL,MOTRIN) tablet 800 mg  800 mg Oral Q6H PRN Curatolo, Adam, DO      . loratadine (CLARITIN) tablet 10 mg  10 mg Oral Daily Curatolo, Adam, DO   10 mg at 11/28/17 1050  . LORazepam (ATIVAN) injection 0-4 mg  0-4 mg Intravenous Q6H Curatolo, Adam, DO       Or  . LORazepam (ATIVAN) tablet 0-4 mg  0-4 mg Oral Q6H Curatolo, Adam, DO   1 mg at 11/27/17 1359  . [START ON 11/29/2017] LORazepam (ATIVAN) injection 0-4 mg  0-4 mg Intravenous Q12H Curatolo, Adam, DO       Or  . Melene Muller ON 11/29/2017] LORazepam (ATIVAN) tablet 0-4 mg  0-4 mg Oral Q12H Curatolo, Adam, DO      . metoprolol succinate (TOPROL-XL) 24 hr tablet 100 mg  100 mg Oral Daily Curatolo, Adam, DO   100 mg at 11/28/17 1050  . [COMPLETED] nicotine (NICODERM CQ - dosed in mg/24 hours) patch 21 mg  21 mg Transdermal Once Cherly Beach, DO   21 mg at 11/28/17 1129  . nicotine (NICODERM CQ - dosed in mg/24 hours) patch 21 mg  21 mg Transdermal Once Cherly Beach, DO      . nicotine (NICODERM CQ - dosed in mg/24 hours) patch 21 mg  21 mg Transdermal Once Cherly Beach, DO      . thiamine (VITAMIN B-1) tablet 100 mg  100 mg Oral Daily Curatolo, Adam, DO   100 mg at 11/28/17 1050   Or  . thiamine (B-1) injection 100 mg  100 mg Intravenous Daily Curatolo, Adam, DO       Current Outpatient Medications  Medication Sig Dispense Refill  . albuterol (PROVENTIL HFA;VENTOLIN HFA) 108 (90 Base) MCG/ACT inhaler Inhale 2 puffs into the lungs every 6 (six) hours as needed for wheezing or shortness of breath.    . clonazePAM (KLONOPIN) 1 MG tablet Take 1 mg by mouth 3 (three) times daily as needed for anxiety.    . cloNIDine (CATAPRES) 0.1 MG tablet Take 1 tablet (0.1 mg total) by mouth 2 (two) times daily. 28 tablet 0  . FLUoxetine (PROZAC) 40 MG capsule Take 40 mg by mouth daily.    Marland Kitchen gabapentin (NEURONTIN) 800 MG tablet Take 1 tablet (800 mg total) by mouth 3 (three) times daily. 42 tablet 0  . ibuprofen  (ADVIL,MOTRIN) 200 MG tablet Take 800 mg by mouth every 6 (six) hours as needed for headache (pain).    Marland Kitchen loratadine (CLARITIN) 10 MG tablet Take 1 tablet (10 mg total) by mouth daily.    . metoprolol succinate (TOPROL-XL) 100 MG 24 hr tablet Take 100 mg by mouth daily. Take with or immediately following a meal.    . nicotine (  NICODERM CQ - DOSED IN MG/24 HOURS) 21 mg/24hr patch Place 1 patch (21 mg total) onto the skin daily. 28 patch 0  . ondansetron (ZOFRAN) 8 MG tablet Take 1 tablet (8 mg total) by mouth every 8 (eight) hours as needed for nausea or vomiting. 10 tablet 0  . sertraline (ZOLOFT) 100 MG tablet Take 1 tablet (100 mg total) by mouth daily.     PTA Medications:  (Not in a hospital admission)  Musculoskeletal: Strength & Muscle Tone: within normal limits Gait & Station: UTA since patient is lying in bed. Patient leans: N/A  Psychiatric Specialty Exam: Physical Exam  Nursing note and vitals reviewed. Constitutional: He is oriented to person, place, and time. He appears well-developed and well-nourished.  HENT:  Head: Normocephalic and atraumatic.  Neck: Normal range of motion.  Respiratory: Effort normal.  Musculoskeletal: Normal range of motion.  Neurological: He is alert and oriented to person, place, and time.    Review of Systems  Psychiatric/Behavioral: Positive for depression and substance abuse. Negative for suicidal ideas.  All other systems reviewed and are negative.   Blood pressure 116/76, pulse 68, temperature 98 F (36.7 C), temperature source Oral, resp. rate 16, SpO2 98 %.There is no height or weight on file to calculate BMI.  General Appearance: Fairly Groomed, middle aged, Caucasian male, wearing paper hospital scrubs with multiple body tattoos who is sitting in bed. NAD.   Eye Contact:  Good  Speech:  Clear and Coherent and Normal Rate  Volume:  Normal  Mood:  Depressed  Affect:  Congruent  Thought Process:  Goal Directed, Linear and Descriptions  of Associations: Intact  Orientation:  Full (Time, Place, and Person)  Thought Content:  Logical  Suicidal Thoughts:  No  Homicidal Thoughts:  No  Memory:  Immediate;   Good Recent;   Good Remote;   Good  Judgement:  Fair  Insight:  Fair  Psychomotor Activity:  Normal  Concentration:  Concentration: Good and Attention Span: Good  Recall:  Good  Fund of Knowledge:  Good  Language:  Good  Akathisia:  No  Handed:  Right  AIMS (if indicated):   N/A  Assets:  Communication Skills Desire for Improvement Intimacy Resilience Social Support  ADL's:  Intact  Cognition:  WNL  Sleep:   N/A     Demographic Factors:  Male and Caucasian  Loss Factors: NA  Historical Factors: Family history of mental illness or substance abuse and Impulsivity  Risk Reduction Factors:   Employed, Living with another person, especially a relative and Positive social support  Continued Clinical Symptoms:  Alcohol/Substance Abuse/Dependencies More than one psychiatric diagnosis  Cognitive Features That Contribute To Risk:  None    Suicide Risk:  Minimal: No identifiable suicidal ideation.  Patients presenting with no risk factors but with morbid ruminations; may be classified as minimal risk based on the severity of the depressive symptoms  Follow-up Information    [facility]. Schedule an appointment as soon as possible for a visit in 3 days.   Why:  Follow up with wellness, follow up elevated liver enzymes. Contact information: [address] [place] [address] [phone]          Assessment:  Kenneth Elliott is a 42 y.o. male who admitted with SI and plan in the setting of alcohol intoxication. Today, he denies SI, HI or AVH. He is future oriented and willing to follow up with outpatient mental health services. He  does not warrant inpatient psychiatric hospitalization at this time.   Plan Of Care/Follow-up recommendations:   -Restarted Gabapentin 800 mg TID for mood stabilization/alcohol use. -Restart Prozac 20 mg daily for mood. -Patient will follow up with his PCP for medication management until he establishes care with a psychiatrist. He will be provided outpatient mental health resources.  -Patient declines substance abuse resources because he does not feel like his alcohol use is problematic.   Disposition: Discharge home. Cherly Beach, DO 11/28/2017, 12:49 PM

## 2017-11-28 NOTE — ED Notes (Signed)
Pt discharged safely after reviewing Discharge instructions and RX.  All belongings were returned and patient contracts for safety.

## 2017-11-28 NOTE — ED Notes (Signed)
Patient has been his room all shift. Patient has not interacted with staff or peers during this shift.

## 2018-01-08 ENCOUNTER — Emergency Department (HOSPITAL_COMMUNITY)
Admission: EM | Admit: 2018-01-08 | Discharge: 2018-01-09 | Disposition: A | Discharge status: Other Acute Inpatient Hospital | Payer: Self-pay | Attending: Emergency Medicine | Admitting: Emergency Medicine

## 2018-01-08 ENCOUNTER — Other Ambulatory Visit: Payer: Self-pay | Admitting: Registered Nurse

## 2018-01-08 ENCOUNTER — Emergency Department (HOSPITAL_COMMUNITY): Payer: Self-pay

## 2018-01-08 DIAGNOSIS — R000 Tachycardia, unspecified: Secondary | ICD-10-CM | POA: Insufficient documentation

## 2018-01-08 DIAGNOSIS — I10 Essential (primary) hypertension: Secondary | ICD-10-CM | POA: Insufficient documentation

## 2018-01-08 DIAGNOSIS — F1910 Other psychoactive substance abuse, uncomplicated: Secondary | ICD-10-CM | POA: Insufficient documentation

## 2018-01-08 DIAGNOSIS — Z046 Encounter for general psychiatric examination, requested by authority: Secondary | ICD-10-CM | POA: Insufficient documentation

## 2018-01-08 DIAGNOSIS — R45851 Suicidal ideations: Secondary | ICD-10-CM | POA: Insufficient documentation

## 2018-01-08 DIAGNOSIS — F329 Major depressive disorder, single episode, unspecified: Secondary | ICD-10-CM | POA: Insufficient documentation

## 2018-01-08 DIAGNOSIS — F419 Anxiety disorder, unspecified: Secondary | ICD-10-CM | POA: Insufficient documentation

## 2018-01-08 DIAGNOSIS — F17210 Nicotine dependence, cigarettes, uncomplicated: Secondary | ICD-10-CM | POA: Insufficient documentation

## 2018-01-08 LAB — CBC
HCT: 41.9 % (ref 39.0–52.0)
Hemoglobin: 13.1 g/dL (ref 13.0–17.0)
MCH: 28.7 pg (ref 26.0–34.0)
MCHC: 31.3 g/dL (ref 30.0–36.0)
MCV: 91.9 fL (ref 80.0–100.0)
Platelets: 235 10*3/uL (ref 150–400)
RBC: 4.56 MIL/uL (ref 4.22–5.81)
RDW: 13.8 % (ref 11.5–15.5)
WBC: 8.3 10*3/uL (ref 4.0–10.5)
nRBC: 0 % (ref 0.0–0.2)

## 2018-01-08 LAB — COMPREHENSIVE METABOLIC PANEL
ALT: 120 U/L — AB (ref 0–44)
AST: 76 U/L — ABNORMAL HIGH (ref 15–41)
Albumin: 3.7 g/dL (ref 3.5–5.0)
Alkaline Phosphatase: 80 U/L (ref 38–126)
Anion gap: 11 (ref 5–15)
BUN: <5 mg/dL — ABNORMAL LOW (ref 6–20)
CHLORIDE: 109 mmol/L (ref 98–111)
CO2: 22 mmol/L (ref 22–32)
CREATININE: 1.05 mg/dL (ref 0.61–1.24)
Calcium: 8.7 mg/dL — ABNORMAL LOW (ref 8.9–10.3)
GFR calc Af Amer: >60 mL/min (ref >60)
GFR calc non Af Amer: >60 mL/min (ref >60)
Glucose, Bld: 85 mg/dL (ref 70–99)
Potassium: 3.2 mmol/L — ABNORMAL LOW (ref 3.5–5.1)
Sodium: 142 mmol/L (ref 135–145)
Total Bilirubin: 0.5 mg/dL (ref 0.3–1.2)
Total Protein: 6.9 g/dL (ref 6.5–8.1)

## 2018-01-08 LAB — ETHANOL: Alcohol, Ethyl (B): 97 mg/dL — ABNORMAL HIGH (ref <10)

## 2018-01-08 LAB — RAPID URINE DRUG SCREEN, HOSP PERFORMED
Amphetamines: NOT DETECTED
Barbiturates: NOT DETECTED
Benzodiazepines: POSITIVE — AB
Cocaine: NOT DETECTED
Opiates: NOT DETECTED
Tetrahydrocannabinol: NOT DETECTED

## 2018-01-08 LAB — I-STAT TROPONIN, ED: Troponin i, poc: 0.01 ng/mL (ref 0.00–0.08)

## 2018-01-08 LAB — ACETAMINOPHEN LEVEL: Acetaminophen (Tylenol), Serum: <10 ug/mL — ABNORMAL LOW (ref 10–30)

## 2018-01-08 LAB — I-STAT CG4 LACTIC ACID, ED: Lactic Acid, Venous: 1.38 mmol/L (ref 0.5–1.9)

## 2018-01-08 LAB — SALICYLATE LEVEL: Salicylate Lvl: <7 mg/dL (ref 2.8–30.0)

## 2018-01-08 MED ORDER — SERTRALINE HCL 100 MG PO TABS
100.0000 mg | ORAL_TABLET | Freq: Every day | ORAL | Status: DC
  Administered 2018-01-08: 100 mg via ORAL
  Filled 2018-01-08: qty 1

## 2018-01-08 MED ORDER — LORAZEPAM 1 MG PO TABS
1.0000 mg | ORAL_TABLET | Freq: Once | ORAL | Status: AC
  Administered 2018-01-08: 1 mg via ORAL
  Filled 2018-01-08: qty 1

## 2018-01-08 MED ORDER — NICOTINE 14 MG/24HR TD PT24
14.0000 mg | MEDICATED_PATCH | Freq: Once | TRANSDERMAL | Status: DC
  Administered 2018-01-08: 14 mg via TRANSDERMAL
  Filled 2018-01-08: qty 1

## 2018-01-08 MED ORDER — CLONIDINE HCL 0.1 MG PO TABS
0.1000 mg | ORAL_TABLET | Freq: Two times a day (BID) | ORAL | Status: DC
  Administered 2018-01-08: 0.1 mg via ORAL
  Filled 2018-01-08 (×2): qty 1

## 2018-01-08 MED ORDER — SODIUM CHLORIDE 0.9 % IV BOLUS
1000.0000 mL | Freq: Once | INTRAVENOUS | Status: AC
  Administered 2018-01-08: 1000 mL via INTRAVENOUS

## 2018-01-08 MED ORDER — LORATADINE 10 MG PO TABS
10.0000 mg | ORAL_TABLET | Freq: Every day | ORAL | Status: DC
  Administered 2018-01-08: 10 mg via ORAL
  Filled 2018-01-08: qty 1

## 2018-01-08 MED ORDER — HYDROXYZINE HCL 50 MG PO TABS
50.0000 mg | ORAL_TABLET | Freq: Once | ORAL | Status: AC
  Administered 2018-01-08: 50 mg via ORAL
  Filled 2018-01-08: qty 1

## 2018-01-08 NOTE — ED Notes (Signed)
Pt at nurses station making phone call 

## 2018-01-08 NOTE — BH Assessment (Addendum)
Tele Assessment Note   Patient Name: Kenneth Elliott MRN: [id number] Referring Physician: Pilar Plate Location of Patient: [facility] ED Location of Provider: Behavioral Health TTS Department  Kenneth Elliott is an 42 y.o. male. The pt came in due to [facility] and drug use.  The pt stated he has used most drugs (cocaine, heroin, crystal meth, valium, morphine, and others).  The pt reported his grandmother died 06-19-22 and he has been thinking about cutting his wrist since then and would cut it if he was discharged from the emergency room.  The pt has had a suicide attempt about 4 years ago by trying to overdose on heroin.  He stated he was given Narcan.  The pt is currently not seeing a counselor or psychiatrist.  He was hospitalized at [facility] in 06/19/15.  The pt was last in SA treatment at a place in [place].  The pt lives alone and works.  He reports he has a history of cutting on his leg.  The pt stated he hasn't cut on his leg in about 4 years.  The pt denies HI, history of abuse and hallucinations, while not using substances.  He is not sleeping well and has a poor appetite.  The pt reported feeling hopeless and worthless.  The pt stated he has recently been using about a vial of ketamine and morphone 3-4 times a week.  The pt is also drinking about 6-8 beers during the week and about a half a gallon of liquor on the weekend.  The pt's UDS is positive for benzodiazepines.  The pt reported he is prescribed medication and takes a liquid form of valium.  His blood alcohol level was 97.  Pt is dressed in scrubs. He is alert and oriented x4. Pt speaks in a clear tone, at moderate volume and normal pace. Eye contact is good. Pt's mood is depressed. Thought process is tangential. There is no indication Pt is currently responding to internal stimuli or experiencing delusional thought content.?Pt was cooperative throughout assessment.    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 Hypertension   . Lupus (HCC)   . Rheumatoid arthritis (HCC)   . Seizures (HCC)    per patient    No past surgical history on file.  Family History: No family history on file.  Social History:  reports that he has been smoking cigarettes. He has been smoking about 1.50 packs per day. He has never used smokeless tobacco. He reports that he drinks alcohol. He reports that he has current or past drug history. Drugs: Cocaine, Marijuana, and Methamphetamines.  Additional Social History:  Alcohol / Drug Use Pain Medications: See MAR Prescriptions: See MAR Over the Counter: See MAR History of alcohol / drug use?: Yes Longest period of sobriety (when/how long): "almost 4 years" Substance #1 Name of Substance 1: morphine 1 - Amount (size/oz): "one Vial" 1 - Frequency: 3-4 times a week 1 - Last Use / Amount: 01/05/2018 Substance #2 Name of Substance 2: ketamine 2 - Amount (size/oz): "one vial" 2 - Frequency: 3-4 times a week 2 - Last Use / Amount: 01/05/2018 Substance #3 Name of Substance 3: alcohol 3 - Amount (size/oz): 6-8 beers during the week and half a gallon on the weekend 3 - Frequency: daily 3 - Last Use /  Amount: 01/08/2018  CIWA: CIWA-Ar BP: (!) 142/106 Pulse Rate: (!) 110 COWS:    Allergies: No Known Allergies  Home Medications:  (Not in a hospital admission)  OB/GYN Status:  No LMP for male patient.  General Assessment Data Location of Assessment: [facility] ED TTS Assessment: In system Is this a Tele or Face-to-Face Assessment?: Face-to-Face Is this an Initial Assessment or a Re-assessment for this encounter?: Initial Assessment Patient Accompanied by:: N/A Language Other than English: No Living Arrangements: Other (Comment)(self) What gender do you identify as?: Male Marital  status: Divorced Jordan name: NA Pregnancy Status: No Living Arrangements: Alone Can pt return to current living arrangement?: Yes Admission Status: Voluntary Is patient capable of signing voluntary admission?: Yes Referral Source: Self/Family/Friend Insurance type: Self Pay     Crisis Care Plan Living Arrangements: Alone Legal Guardian: Other:(Self) Name of Psychiatrist: none Name of Therapist: none  Education Status Is patient currently in school?: No Is the patient employed, unemployed or receiving disability?: Employed  Risk to self with the past 6 months Suicidal Ideation: Yes-Currently Present Has patient been a risk to self within the past 6 months prior to admission? : No Suicidal Intent: Yes-Currently Present Has patient had any suicidal intent within the past 6 months prior to admission? : Yes Is patient at risk for suicide?: Yes Suicidal Plan?: Yes-Currently Present Has patient had any suicidal plan within the past 6 months prior to admission? : Yes Specify Current Suicidal Plan: cut wrist Access to Means: Yes Specify Access to Suicidal Means: can get a knife What has been your use of drugs/alcohol within the last 12 months?: morphine, alcohol and ketamine Previous Attempts/Gestures: Yes How many times?: 1 Other Self Harm Risks: none Triggers for Past Attempts: Unpredictable Intentional Self Injurious Behavior: Cutting Comment - Self Injurious Behavior: history of cutting Family Suicide History: No Recent stressful life event(s): Loss (Comment)(grand mother died 2022/06/15) Persecutory voices/beliefs?: No Depression: Yes Depression Symptoms: Despondent, Insomnia, Loss of interest in usual pleasures, Feeling worthless/self pity Substance abuse history and/or treatment for substance abuse?: Yes Suicide prevention information given to non-admitted patients: Not applicable  Risk to Others within the past 6 months Homicidal Ideation: No Does patient have any  lifetime risk of violence toward others beyond the six months prior to admission? : No Thoughts of Harm to Others: No Current Homicidal Intent: No Current Homicidal Plan: No Access to Homicidal Means: No Identified Victim: none History of harm to others?: No Assessment of Violence: None Noted Violent Behavior Description: no Does patient have access to weapons?: No Criminal Charges Pending?: No Does patient have a court date: No Is patient on probation?: No  Psychosis Hallucinations: None noted Delusions: None noted  Mental Status Report Appearance/Hygiene: Unremarkable Eye Contact: Good Motor Activity: Freedom of movement, Unremarkable Speech: Logical/coherent Level of Consciousness: Alert Mood: Depressed Affect: Depressed Anxiety Level: Minimal Thought Processes: Coherent, Tangential Judgement: Impaired Orientation: Person, Place, Time, Situation Obsessive Compulsive Thoughts/Behaviors: None  Cognitive Functioning Concentration: Normal Memory: Recent Intact, Remote Intact Is patient IDD: No Insight: Poor Impulse Control: Poor Appetite: Poor Have you had any weight changes? : No Change Sleep: Decreased Total Hours of Sleep: 4 Vegetative Symptoms: None  ADLScreening [facility] Assessment Services) Patient's cognitive ability adequate to safely complete daily activities?: Yes Patient able to express need for assistance with ADLs?: Yes Independently performs ADLs?: Yes (appropriate for developmental age)  Prior Inpatient Therapy Prior Inpatient Therapy: Yes Prior Therapy Dates: 2017, 2016 Prior Therapy Facilty/Provider(s): [facility], rehab in [place] Reason for  Treatment: substance abuse; depression  Prior Outpatient Therapy Prior Outpatient Therapy: Yes Prior Therapy Dates: 4 years ago Prior Therapy Facilty/Provider(s): pt can't remember Reason for Treatment: substance abuse; bipolar Does patient have an ACCT team?: No Does patient have Intensive In-House  Services?  : No Does patient have [facility] services? : No Does patient have P4CC services?: No  ADL Screening (condition at time of admission) Patient's cognitive ability adequate to safely complete daily activities?: Yes Patient able to express need for assistance with ADLs?: Yes Independently performs ADLs?: Yes (appropriate for developmental age)       Abuse/Neglect Assessment (Assessment to be complete while patient is alone) Abuse/Neglect Assessment Can Be Completed: Yes Physical Abuse: Denies Verbal Abuse: Denies Sexual Abuse: Denies Exploitation of patient/patient's resources: Denies Self-Neglect: Denies Values / Beliefs Cultural Requests During Hospitalization: None Spiritual Requests During Hospitalization: None Consults Spiritual Care Consult Needed: No Social Work Consult Needed: No            Disposition:  Disposition Initial Assessment Completed for this Encounter: Yes NP Kayren Eaves recommends inpatient treatment.  MD Pilar Plate was made aware of the recommendation.  This service was provided via telemedicine using a 2-way, interactive audio and video technology.  Names of all persons participating in this telemedicine service and their role in this encounter. Name: Ethridge Sollenberger  Role: Pt  Name: Riley Churches Role: TTS  Name:  Role:   Name:  Role:     Ottis Stain 01/08/2018 4:12 PM

## 2018-01-08 NOTE — ED Notes (Signed)
Pt belongings inventoried, put in locker #1, valuables brought to security. 1 needle was taken from wallet and discarded in sharps.

## 2018-01-08 NOTE — ED Provider Notes (Signed)
[facility] Emergency Department Provider Note MRN:  [id number]  Arrival date & time: 01/08/18     Chief Complaint   Suicidal   History of Present Illness   Kenneth Elliott is a 42 y.o. year-old male with a history of hypertension, bipolar disorder presenting to the ED with chief complaint of suicidal ideation.  Patient has had several loved ones passed away within the past few months.  This is caused him to have severe depression, thoughts of suicide.  Has caused him to relapse, has been abusing IV drugs, such as morphine and ketamine from the street.  Last use this past weekend.  Having serious thoughts of intentionally overdosing on IV drugs to kill himself.  Had a specific plan, with the exact meds and exact doses he would use.  Did not do it because he was concerned he would mess it up and being in a long-term, instead of being dead.  Denies homicidal ideation, no AVH.  No recent fever, no chest pain or shortness of breath, no abdominal pain.  Review of Systems  A complete 10 system review of systems was obtained and all systems are negative except as noted in the HPI and PMH.   Patient's Health History    Past Medical History:  Diagnosis Date  . Hypertension   . Lupus (HCC)   . Rheumatoid arthritis (HCC)   . Seizures (HCC)    per patient    No past surgical history on file.  No family history on file.  Social History   Socioeconomic History  . Marital status: Divorced    Spouse name: Not on file  . Number of children: Not on file  . Years of education: Not on file  . Highest education level: Not on file  Occupational History  . Not on file  Social Needs  . Financial resource strain: Not on file  . Food insecurity:    Worry: Not on file    Inability: Not on file  . Transportation needs:    Medical: Not on file    Non-medical: Not on file  Tobacco Use  . Smoking status: Current Every Day Smoker    Packs/day: 1.50    Types: Cigarettes  .  Smokeless tobacco: Never Used  Substance and Sexual Activity  . Alcohol use: Yes  . Drug use: Yes    Types: Cocaine, Marijuana, Methamphetamines    Comment: suboxone  . Sexual activity: Yes    Partners: Female  Lifestyle  . Physical activity:    Days per week: Not on file    Minutes per session: Not on file  . Stress: Not on file  Relationships  . Social connections:    Talks on phone: Not on file    Gets together: Not on file    Attends religious service: Not on file    Active member of club or organization: Not on file    Attends meetings of clubs or organizations: Not on file    Relationship status: Not on file  . Intimate partner violence:    Fear of current or ex partner: Not on file    Emotionally abused: Not on file    Physically abused: Not on file    Forced sexual activity: Not on file  Other Topics Concern  . Not on file  Social History Narrative  . Not on file     Physical Exam  Vital Signs and Nursing Notes reviewed Vitals:   01/08/18 1333 01/08/18 1432  BP: (!) 145/103 (!) 142/106  Pulse: (!) 144 (!) 110  Resp: 16 18  Temp: 98.8 F (37.1 C)   SpO2: 98% 98%    CONSTITUTIONAL: Chronically ill-appearing, NAD NEURO:  Alert and oriented x 3, no focal deficits EYES:  eyes equal and reactive ENT/NECK:  no LAD, no JVD CARDIO: Tachycardic rate, well-perfused, normal S1 and S2; no murmur PULM:  CTAB no wheezing or rhonchi GI/GU:  normal bowel sounds, non-distended, non-tender MSK/SPINE:  No gross deformities, no edema SKIN:  no rash, atraumatic; track marks bilateral antecubital fossa PSYCH:  Appropriate speech and behavior  Diagnostic and Interventional Summary    EKG Interpretation  Date/Time:  Wednesday January 08 2018 16:21:23 EST Ventricular Rate:  105 PR Interval:  132 QRS Duration: 94 QT Interval:  338 QTC Calculation: 446 R Axis:   58 Text Interpretation:  Sinus tachycardia Otherwise normal ECG Confirmed by Kennis Carina [phone]) on  01/08/2018 4:02:35 PM      Labs Reviewed  COMPREHENSIVE METABOLIC PANEL - Abnormal; Notable for the following components:      Result Value   Potassium 3.2 (*)    BUN <5 (*)    Calcium 8.7 (*)    AST 76 (*)    ALT 120 (*)    All other components within normal limits  ETHANOL - Abnormal; Notable for the following components:   Alcohol, Ethyl (B) 97 (*)    All other components within normal limits  ACETAMINOPHEN LEVEL - Abnormal; Notable for the following components:   Acetaminophen (Tylenol), Serum <10 (*)    All other components within normal limits  RAPID URINE DRUG SCREEN, HOSP PERFORMED - Abnormal; Notable for the following components:   Benzodiazepines POSITIVE (*)    All other components within normal limits  SALICYLATE LEVEL  CBC  I-STAT TROPONIN, ED  I-STAT CG4 LACTIC ACID, ED    DG Chest Port 1 View  Final Result      Medications  sodium chloride 0.9 % bolus 1,000 mL (1,000 mLs Intravenous New Bag/Given 01/08/18 1600)  nicotine (NICODERM CQ - dosed in mg/24 hours) patch 14 mg (14 mg Transdermal Patch Applied 01/08/18 1630)  LORazepam (ATIVAN) tablet 1 mg (1 mg Oral Given 01/08/18 1559)     Procedures Critical Care  ED Course and Medical Decision Making  I have reviewed the triage vital signs and the nursing notes.  Pertinent labs & imaging results that were available during my care of the patient were reviewed by me and considered in my medical decision making (see below for details).  Suicidal ideation with a specific plan this 42 year old male who is here voluntarily for help.  Will definitely need behavioral health evaluation and likely inpatient management.  He is otherwise well-appearing, mildly tachycardic here in the ED, states that he is anxious.  Will obtain screening labs, EKG, chest x-ray, troponin to exclude metabolic disarray, signs of systemic infection.  Tachycardia resolved after fluids, labs are reassuring, normal chest x-ray.  Patient is medically  cleared.  Per TTS, needs inpatient treatment for his suicidality.  Will place in psych hold, signed out to default provider.  Elmer Sow. Pilar Plate, MD [facility] Emergency Medicine [facility] [email or web address]  Final Clinical Impressions(s) / ED Diagnoses     ICD-10-CM   1. Suicidal ideation R45.851   2. Tachycardia R00.0 DG Chest [facility] 1 View    DG Chest Port 1 View  3. Polysubstance abuse (HCC) F19.10     ED Discharge Orders  None         Sabas Sous, MD 01/08/18 [phone]

## 2018-01-08 NOTE — ED Notes (Signed)
Fluids not present on arrival to unit.  D/C'd in chart

## 2018-01-08 NOTE — ED Notes (Signed)
Behavioral health at bedside.

## 2018-01-08 NOTE — ED Notes (Signed)
Message sent to Dr. Pilar Plate requesting home meds be ordered while in psych hold.

## 2018-01-08 NOTE — ED Notes (Signed)
Radiology at bedside for CXR

## 2018-01-08 NOTE — Progress Notes (Signed)
Pt accepted to [facility]; room 300-2 Shuvon Rankin, NP is the accepting provider.   Dr. Jola Babinski is the attending provider.  Call report to [phone]   Aud[facility] @ [facility] ED notified.  Pt is voluntary and will be transported by [facility].  Pt is scheduled to arrive at [facility] at 10pm.   Wells Guiles, LCSW, LCAS Disposition CSW [facility] [name]/TTS [phone]

## 2018-01-08 NOTE — ED Notes (Signed)
Per nurse first- pt stepped outside

## 2018-01-08 NOTE — ED Triage Notes (Signed)
Pt arrives to ED with c/o of SI thought since grandmother passed 2 days ago. Pt was cleaned for 4 years until 5 months ago started using IV drugs pt states he uses ketamine IV that buys on the street trying to kill himself. Pt also drinks alcohol daily last drank 3 hours.

## 2018-01-09 ENCOUNTER — Inpatient Hospital Stay (HOSPITAL_COMMUNITY)
Admission: AD | Admit: 2018-01-09 | Discharge: 2018-01-11 | DRG: 885 | Disposition: A | Discharge status: Home / Self Care | Payer: Federal, State, Local not specified - Other | Source: Intra-hospital | Attending: Psychiatry | Admitting: Psychiatry

## 2018-01-09 ENCOUNTER — Other Ambulatory Visit: Payer: Self-pay

## 2018-01-09 ENCOUNTER — Encounter (HOSPITAL_COMMUNITY): Payer: Self-pay | Admitting: *Deleted

## 2018-01-09 DIAGNOSIS — Z79899 Other long term (current) drug therapy: Secondary | ICD-10-CM | POA: Diagnosis not present

## 2018-01-09 DIAGNOSIS — F17210 Nicotine dependence, cigarettes, uncomplicated: Secondary | ICD-10-CM | POA: Diagnosis present

## 2018-01-09 DIAGNOSIS — F1920 Other psychoactive substance dependence, uncomplicated: Secondary | ICD-10-CM | POA: Diagnosis present

## 2018-01-09 DIAGNOSIS — R45851 Suicidal ideations: Secondary | ICD-10-CM | POA: Diagnosis present

## 2018-01-09 DIAGNOSIS — M069 Rheumatoid arthritis, unspecified: Secondary | ICD-10-CM | POA: Diagnosis present

## 2018-01-09 DIAGNOSIS — F419 Anxiety disorder, unspecified: Secondary | ICD-10-CM | POA: Diagnosis present

## 2018-01-09 DIAGNOSIS — F1020 Alcohol dependence, uncomplicated: Secondary | ICD-10-CM | POA: Diagnosis present

## 2018-01-09 DIAGNOSIS — Z915 Personal history of self-harm: Secondary | ICD-10-CM

## 2018-01-09 DIAGNOSIS — F322 Major depressive disorder, single episode, severe without psychotic features: Secondary | ICD-10-CM | POA: Diagnosis present

## 2018-01-09 DIAGNOSIS — M329 Systemic lupus erythematosus, unspecified: Secondary | ICD-10-CM | POA: Diagnosis present

## 2018-01-09 DIAGNOSIS — G8929 Other chronic pain: Secondary | ICD-10-CM | POA: Diagnosis present

## 2018-01-09 DIAGNOSIS — I10 Essential (primary) hypertension: Secondary | ICD-10-CM | POA: Diagnosis present

## 2018-01-09 DIAGNOSIS — Z811 Family history of alcohol abuse and dependence: Secondary | ICD-10-CM

## 2018-01-09 DIAGNOSIS — R768 Other specified abnormal immunological findings in serum: Secondary | ICD-10-CM | POA: Diagnosis present

## 2018-01-09 DIAGNOSIS — Z634 Disappearance and death of family member: Secondary | ICD-10-CM

## 2018-01-09 DIAGNOSIS — Z818 Family history of other mental and behavioral disorders: Secondary | ICD-10-CM | POA: Diagnosis not present

## 2018-01-09 DIAGNOSIS — F332 Major depressive disorder, recurrent severe without psychotic features: Principal | ICD-10-CM

## 2018-01-09 MED ORDER — MAGNESIUM HYDROXIDE 400 MG/5ML PO SUSP: 5.0000 mL | Freq: Every evening | ORAL | Status: DC | PRN

## 2018-01-09 MED ORDER — GABAPENTIN 400 MG PO CAPS
800.0000 mg | ORAL_CAPSULE | Freq: Three times a day (TID) | ORAL | Status: DC
  Administered 2018-01-09 – 2018-01-11 (×8): 800 mg via ORAL
  Filled 2018-01-09 (×2): qty 2
  Filled 2018-01-09: qty 42
  Filled 2018-01-09 (×2): qty 2
  Filled 2018-01-09 (×2): qty 42
  Filled 2018-01-09 (×2): qty 2
  Filled 2018-01-09: qty 42
  Filled 2018-01-09 (×3): qty 2
  Filled 2018-01-09: qty 42
  Filled 2018-01-09: qty 2
  Filled 2018-01-09: qty 42

## 2018-01-09 MED ORDER — IBUPROFEN 600 MG PO TABS
600.0000 mg | ORAL_TABLET | Freq: Four times a day (QID) | ORAL | Status: DC | PRN
  Administered 2018-01-09: 600 mg via ORAL
  Filled 2018-01-09: qty 1

## 2018-01-09 MED ORDER — NICOTINE 21 MG/24HR TD PT24
21.0000 mg | MEDICATED_PATCH | Freq: Every day | TRANSDERMAL | Status: DC
  Administered 2018-01-09 – 2018-01-11 (×3): 21 mg via TRANSDERMAL
  Filled 2018-01-09 (×5): qty 1

## 2018-01-09 MED ORDER — LOPERAMIDE HCL 2 MG PO CAPS
2.0000 mg | ORAL_CAPSULE | Freq: Three times a day (TID) | ORAL | Status: DC | PRN
  Administered 2018-01-09: 2 mg via ORAL

## 2018-01-09 MED ORDER — PANTOPRAZOLE SODIUM 40 MG PO TBEC
40.0000 mg | DELAYED_RELEASE_TABLET | Freq: Every day | ORAL | Status: DC
  Administered 2018-01-09 – 2018-01-11 (×3): 40 mg via ORAL
  Filled 2018-01-09 (×6): qty 1

## 2018-01-09 MED ORDER — LOPERAMIDE HCL 2 MG PO CAPS
ORAL_CAPSULE | ORAL | Status: AC
  Administered 2018-01-09: 2 mg via ORAL
  Filled 2018-01-09: qty 1

## 2018-01-09 MED ORDER — METOPROLOL SUCCINATE ER 50 MG PO TB24
100.0000 mg | ORAL_TABLET | Freq: Every day | ORAL | Status: DC
  Administered 2018-01-09 – 2018-01-11 (×3): 100 mg via ORAL
  Filled 2018-01-09: qty 1
  Filled 2018-01-09 (×2): qty 14
  Filled 2018-01-09: qty 2
  Filled 2018-01-09 (×3): qty 1

## 2018-01-09 MED ORDER — MIRTAZAPINE 7.5 MG PO TABS
7.5000 mg | ORAL_TABLET | Freq: Every day | ORAL | Status: DC
  Administered 2018-01-09 – 2018-01-10 (×2): 7.5 mg via ORAL
  Filled 2018-01-09 (×3): qty 1
  Filled 2018-01-09: qty 7
  Filled 2018-01-09: qty 1
  Filled 2018-01-09: qty 7

## 2018-01-09 MED ORDER — FLUOXETINE HCL 20 MG PO CAPS
40.0000 mg | ORAL_CAPSULE | Freq: Every day | ORAL | Status: DC
  Administered 2018-01-09 – 2018-01-11 (×3): 40 mg via ORAL
  Filled 2018-01-09 (×2): qty 2
  Filled 2018-01-09: qty 14
  Filled 2018-01-09 (×2): qty 2
  Filled 2018-01-09: qty 14
  Filled 2018-01-09: qty 2

## 2018-01-09 MED ORDER — HYDROXYZINE HCL 25 MG PO TABS
25.0000 mg | ORAL_TABLET | Freq: Three times a day (TID) | ORAL | Status: DC | PRN
  Administered 2018-01-09 – 2018-01-11 (×6): 25 mg via ORAL
  Filled 2018-01-09 (×6): qty 1

## 2018-01-09 MED ORDER — LORAZEPAM 1 MG PO TABS: 1.0000 mg | ORAL_TABLET | Freq: Four times a day (QID) | ORAL | Status: DC | PRN

## 2018-01-09 MED ORDER — ACETAMINOPHEN 325 MG PO TABS: 650.0000 mg | ORAL_TABLET | Freq: Four times a day (QID) | ORAL | Status: DC | PRN
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
## 2018-01-09 NOTE — Tx Team (Signed)
Initial Treatment Plan 01/09/2018 4:40 AM Kenneth Elliott BUL:[id number]    PATIENT STRESSORS: Financial difficulties Loss of several family members recently Medication change or noncompliance Substance abuse   PATIENT STRENGTHS: Average or above average intelligence Capable of independent living General fund of knowledge Motivation for treatment/growth   PATIENT IDENTIFIED PROBLEMS: Polysub abuse  Depression  Risk for self harm  Grief and loss    "Clear my mind"  "Get off of drugs and alcohol"  "Get my moods under control"       DISCHARGE CRITERIA:  Adequate post-discharge living arrangements Improved stabilization in mood, thinking, and/or behavior Motivation to continue treatment in a less acute level of care Verbal commitment to aftercare and medication compliance Withdrawal symptoms are absent or subacute and managed without 24-hour nursing intervention  PRELIMINARY DISCHARGE PLAN: Attend aftercare/continuing care group Attend 12-step recovery group Outpatient therapy Return to previous living arrangement  PATIENT/FAMILY INVOLVEMENT: This treatment plan has been presented to and reviewed with the patient, Kenneth Elliott, and/or family member.  The patient and family have been given the opportunity to ask questions and make suggestions.  Charlott Holler, RN 01/09/2018, 4:40 AM

## 2018-01-09 NOTE — Progress Notes (Signed)
Patient ID: Kenneth Elliott, male   DOB: July 29, 1975, 42 y.o.   MRN: [id number]  Nursing Progress Note [id number]  Data: Patient presents pleasant and cooperative but is anxious at times. Patient compliant with scheduled medications. Patient denies pain/physical complaints. Patient is seen attending groups and visible in the milieu and expresses interest in developing his discharge plan. Patient currently denies SI/HI/AVH.   Action: Patient is educated about and provided medication per provider's orders. Patient safety maintained with q15 min safety checks and frequent rounding. Low fall risk precautions in place. Emotional support given. 1:1 interaction and active listening provided. Patient encouraged to attend meals, groups, and work on treatment plan and goals. Labs, vital signs and patient behavior monitored throughout shift.   Response: Patient remains safe on the unit at this time and agrees to come to staff with any issues/concerns. Patient is interacting with peers appropriately on the unit. Will continue to support and monitor.

## 2018-01-09 NOTE — Progress Notes (Signed)
Admission Note:  Vol admit, 42 yo caucasian male, admitted for detox, reporting use of multiple substances, but was only positive for benzos.  His BAL was 97.  Pt reports multiple family losses, most recently his grandmother.  He reported to Clinical research associate during the admission that he wanted to stop using drugs and alcohol and get his life "straightened out" so he could "clear his mind".  He said he wants to clean for his girlfriend who is very supportive.  Pt reports a hx of Lupus, Rheumatoid arthritis, HTN.  He denied seizures although it is listed in his medical hx.  Pt was cooperative with the admission process.  All paperwork was signed and search completed.  Pt declined a meal, but was given a beverage.  Pt has been here in the past, therefore was oriented briefly to the unit.  Safety checks q15 minutes were initiated.

## 2018-01-09 NOTE — Progress Notes (Signed)
[name] Group Notes:  (Nursing/MHT/Case Management/Adjunct)  Date:  01/09/2018  Time:  2045 Type of Therapy:  wrap up group  Participation Level:  Active  Participation Quality:  Appropriate, Attentive and Sharing  Affect:  Irritable  Cognitive:  Appropriate  Insight:  Improving  Engagement in Group:  Improving  Modes of Intervention:  Clarification, Education and Support  Summary of Progress/Problems: Pt shared that his good for the day is that he woke up. Pt is grateful for his soon to be wife. If patient could change any one thing about his life it would be for him to think before he acts.   Marcille Buffy 01/09/2018, 10:45 PM

## 2018-01-09 NOTE — Tx Team (Addendum)
Interdisciplinary Treatment and Diagnostic Plan Update  01/10/2018 Time of Session: 0830AM Kenneth Elliott MRN: [id number]  Principal Diagnosis: MDD, recurrent, severe  Secondary Diagnoses: Active Problems:   MDD (major depressive disorder), severe (HCC)   Current Medications:  Current Facility-Administered Medications  Medication Dose Route Frequency Provider Last Rate Last Dose  . FLUoxetine (PROZAC) capsule 40 mg  40 mg Oral Daily Antonieta Pert, MD   40 mg at 01/10/18 0806  . gabapentin (NEURONTIN) capsule 800 mg  800 mg Oral TID Antonieta Pert, MD   800 mg at 01/10/18 0940  . hydrOXYzine (ATARAX/VISTARIL) tablet 25 mg  25 mg Oral TID PRN Antonieta Pert, MD   25 mg at 01/10/18 7680  . ibuprofen (ADVIL,MOTRIN) tablet 600 mg  600 mg Oral Q6H PRN Kerry Hough, PA-C   600 mg at 01/09/18 2119  . loperamide (IMODIUM) capsule 2 mg  2 mg Oral Q8H PRN Armandina Stammer I, NP   2 mg at 01/09/18 1620  . LORazepam (ATIVAN) tablet 1 mg  1 mg Oral Q6H PRN Antonieta Pert, MD      . magnesium hydroxide (MILK OF MAGNESIA) suspension 5 mL  5 mL Oral QHS PRN Antonieta Pert, MD      . metoprolol succinate (TOPROL-XL) 24 hr tablet 100 mg  100 mg Oral Daily Antonieta Pert, MD   100 mg at 01/10/18 0806  . mirtazapine (REMERON) tablet 7.5 mg  7.5 mg Oral QHS Nwoko, Agnes I, NP   7.5 mg at 01/09/18 2111  . nicotine (NICODERM CQ - dosed in mg/24 hours) patch 21 mg  21 mg Transdermal Daily Donell Sievert E, PA-C   21 mg at 01/10/18 8811  . pantoprazole (PROTONIX) EC tablet 40 mg  40 mg Oral Daily Armandina Stammer I, NP   40 mg at 01/10/18 0315   PTA Medications: Medications Prior to Admission  Medication Sig Dispense Refill Last Dose  . cloNIDine (CATAPRES) 0.1 MG tablet Take 1 tablet (0.1 mg total) by mouth 2 (two) times daily. (Patient not taking: Reported on 01/08/2018) 28 tablet 0 Not Taking at Unknown time  . FLUoxetine (PROZAC) 40 MG capsule Take 40 mg by mouth daily.   01/08/2018 at  Unknown time  . gabapentin (NEURONTIN) 400 MG capsule Take 2 capsules (800 mg total) by mouth 3 (three) times daily. 60 capsule 0 01/08/2018 at Unknown time  . loratadine (CLARITIN) 10 MG tablet Take 1 tablet (10 mg total) by mouth daily. (Patient not taking: Reported on 01/08/2018)   Not Taking at Unknown time  . sertraline (ZOLOFT) 100 MG tablet Take 1 tablet (100 mg total) by mouth daily. (Patient not taking: Reported on 01/08/2018)   Not Taking at Unknown time    Patient Stressors: Financial difficulties Loss of several family members recently Medication change or noncompliance Substance abuse  Patient Strengths: Average or above average intelligence Capable of independent living General fund of knowledge Motivation for treatment/growth  Treatment Modalities: Medication Management, Group therapy, Case management,  1 to 1 session with clinician, Psychoeducation, Recreational therapy.   Physician Treatment Plan for Primary Diagnosis: MDD, recurrent, severe Long Term Goal(s): Improvement in symptoms so as ready for discharge Improvement in symptoms so as ready for discharge   Short Term Goals: Ability to identify changes in lifestyle to reduce recurrence of condition will improve Ability to verbalize feelings will improve Ability to disclose and discuss suicidal ideas Ability to demonstrate self-control will improve Ability to identify and develop  effective coping behaviors will improve Ability to maintain clinical measurements within normal limits will improve Ability to identify triggers associated with substance abuse/mental health issues will improve Ability to identify changes in lifestyle to reduce recurrence of condition will improve Ability to verbalize feelings will improve Ability to disclose and discuss suicidal ideas Ability to demonstrate self-control will improve Ability to identify and develop effective coping behaviors will improve Ability to maintain clinical  measurements within normal limits will improve Ability to identify triggers associated with substance abuse/mental health issues will improve  Medication Management: Evaluate patient's response, side effects, and tolerance of medication regimen.  Therapeutic Interventions: 1 to 1 sessions, Unit Group sessions and Medication administration.  Evaluation of Outcomes: Progressing  Physician Treatment Plan for Secondary Diagnosis: Active Problems:   MDD (major depressive disorder), severe (HCC)  Long Term Goal(s): Improvement in symptoms so as ready for discharge Improvement in symptoms so as ready for discharge   Short Term Goals: Ability to identify changes in lifestyle to reduce recurrence of condition will improve Ability to verbalize feelings will improve Ability to disclose and discuss suicidal ideas Ability to demonstrate self-control will improve Ability to identify and develop effective coping behaviors will improve Ability to maintain clinical measurements within normal limits will improve Ability to identify triggers associated with substance abuse/mental health issues will improve Ability to identify changes in lifestyle to reduce recurrence of condition will improve Ability to verbalize feelings will improve Ability to disclose and discuss suicidal ideas Ability to demonstrate self-control will improve Ability to identify and develop effective coping behaviors will improve Ability to maintain clinical measurements within normal limits will improve Ability to identify triggers associated with substance abuse/mental health issues will improve     Medication Management: Evaluate patient's response, side effects, and tolerance of medication regimen.  Therapeutic Interventions: 1 to 1 sessions, Unit Group sessions and Medication administration.  Evaluation of Outcomes: Progressing   RN Treatment Plan for Primary Diagnosis: MDD, recurrent, severe Long Term Goal(s): Knowledge  of disease and therapeutic regimen to maintain health will improve  Short Term Goals: Ability to remain free from injury will improve, Ability to verbalize feelings will improve and Ability to disclose and discuss suicidal ideas  Medication Management: RN will administer medications as ordered by provider, will assess and evaluate patient's response and provide education to patient for prescribed medication. RN will report any adverse and/or side effects to prescribing provider.  Therapeutic Interventions: 1 on 1 counseling sessions, Psychoeducation, Medication administration, Evaluate responses to treatment, Monitor vital signs and CBGs as ordered, Perform/monitor CIWA, COWS, AIMS and Fall Risk screenings as ordered, Perform wound care treatments as ordered.  Evaluation of Outcomes: Progressing   LCSW Treatment Plan for Primary Diagnosis: MDD, recurrent, severe Long Term Goal(s): Safe transition to appropriate next level of care at discharge, Engage patient in therapeutic group addressing interpersonal concerns.  Short Term Goals: Engage patient in aftercare planning with referrals and resources, Facilitate patient progression through stages of change regarding substance use diagnoses and concerns and Identify triggers associated with mental health/substance abuse issues  Therapeutic Interventions: Assess for all discharge needs, 1 to 1 time with Social worker, Explore available resources and support systems, Assess for adequacy in community support network, Educate family and significant other(s) on suicide prevention, Complete Psychosocial Assessment, Interpersonal group therapy.  Evaluation of Outcomes: Progressing   Progress in Treatment: Attending groups: No. Participating in groups: No. Taking medication as prescribed: No. Toleration medication: Yes. Family/Significant other contact made: SPE completed with pt;  pt declined to consent to collateral contact.  Patient understands  diagnosis: Yes. Discussing patient identified problems/goals with staff: Yes. Medical problems stabilized or resolved: Yes. Denies suicidal/homicidal ideation: Yes. Issues/concerns per patient self-inventory: No. Other: n/a   New problem(s) identified: No, Describe:  n/a  New Short Term/Long Term Goal(s): detox, medication management for mood stabilization; elimination of SI thoughts; development of comprehensive mental wellness/sobriety plan.   Patient Goals:  "to clear my mind and get off drugs and alcohol. I need help with my mood."   Discharge Plan or Barriers: CSW assessing for appropriate referrals. Pt plans to return home and follow-up at [facility] for outpatient mental health treatment. MHAG pamphlet, Mobile Crisis information, and AA/NA information provided to patient for additional community support and resources.   Reason for Continuation of Hospitalization: Anxiety Depression Medication stabilization Withdrawal symptoms  Estimated Length of Stay: Saturday, 01/11/18  Attendees: Patient: 01/10/2018 10:01 AM  Physician: Dr. Jola Babinski MD 01/10/2018 10:01 AM  Nursing: Rodell Perna RN; Ethelene Browns RN 01/10/2018 10:01 AM  RN Care Manager:x 01/10/2018 10:01 AM  Social Worker: Corrie Mckusick LCSW 01/10/2018 10:01 AM  Recreational Therapist: x 01/10/2018 10:01 AM  Other: Armandina Stammer NP 01/10/2018 10:01 AM  Other:  01/10/2018 10:01 AM  Other: 01/10/2018 10:01 AM    Scribe for Treatment Team: Rona Ravens, LCSW 01/10/2018 10:01 AM

## 2018-01-09 NOTE — [facility] Group Notes (Signed)
Adult Nursing/MHT Psychoeducational Group Note  Date:  01/09/2018  Time: 4:00 PM  Group Topic/Focus: Personal Choices and Values The focus of this group is to help patients assess and explore the importance of values in their lives, how their values affect their decisions, how they express their values and what opposes their expression. Lead By: Alyssa J. RN, and Michael L, MHT  Participation Level:  Active  Participation Quality:  Appropriate and Attentive  Affect:  Appropriate  Cognitive:  Alert and Oriented  Insight: Improving  Engagement in Group:  Developing/Improving  Modes of Intervention:  Discussion and Education  Additional Comments:  The patients were provided a beach ball with various questions. Patients took turn tossing the ball and answering questions while engaging in socialization and discussion.   Kenneth Elliott 01/09/2018 5:00 PM 

## 2018-01-09 NOTE — ED Notes (Signed)
Report given to Erskine Squibb RN at [facility] , [place] transport services notified , pt. advised by RN on his transfer with no objections . Pt.'s personal belongings Caryn Section from security returned to pt.
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
## 2018-01-09 NOTE — Progress Notes (Signed)
Pt shared that he is feeling much better since admission and is hoping to discharge on Saturday. He plans to return home with his girlfriend and is agreeable to following up at [facility] for outpatient mental health services including medication management. Pt states, "when I'm on my meds, I do well. But I got off and the little voice in my head told me it's okay to use just once." Pt reports he relapsed after 1 1/2 years of clean time. Pt pleasant and calm during interaction.   Chukwudi Ewen S. Alan Ripper, MSW, LCSW Clinical Social Worker 01/09/2018 1:45 PM

## 2018-01-09 NOTE — Progress Notes (Signed)
D: Pt was in dayroom upon initial approach.  Pt presents with appropriate affect and depressed mood.  Pt describes his day as "all right, fine."  His goal is to "get out of here Saturday" and he reports he discussed this with social worker today.  Pt denies SI/HI, denies hallucinations, reports L ring finger and L middle finger pain of 6/10.  Pt has been visible in milieu interacting with peers and staff appropriately.  Pt attended evening group.    A: Introduced self to pt.  Actively listened to pt and offered support and encouragement. Medication administered per order.  PRN medication administered for pain and anxiety.  Q15 minute safety checks maintained.  R: Pt is safe on the unit.  Pt is compliant with medications.  Pt verbally contracts for safety.  Will continue to monitor and assess.

## 2018-01-09 NOTE — [facility] Suicide Risk Assessment (Signed)
[name] INPATIENT:  Family/Significant Other Suicide Prevention Education  Suicide Prevention Education:  Patient Refusal for Family/Significant Other Suicide Prevention Education: The patient Kenneth Elliott has refused to provide written consent for family/significant other to be provided Family/Significant Other Suicide Prevention Education during admission and/or prior to discharge.  Physician notified.  SPE completed with pt, as pt refused to consent to family contact. SPI pamphlet provided to pt and pt was encouraged to share information with support network, ask questions, and talk about any concerns relating to SPE. Pt denies access to guns/firearms and verbalized understanding of information provided. Mobile Crisis information also provided to pt.   Rona Ravens LCSW 01/09/2018, 10:25 AM

## 2018-01-09 NOTE — H&P (Signed)
Psychiatric Admission Assessment Adult  Patient Identification: Kenneth Elliott MRN:  [id number] Date of Evaluation:  01/09/2018 Chief Complaint:  Mdd Polysubstance use disorder Principal Diagnosis: <principal problem not specified> Diagnosis:  Active Problems:   MDD (major depressive disorder), severe (HCC)  History of Present Illness: Patient is seen and examined.  Patient is a 42 year old male with a past psychiatric history significant for polysubstance dependence as well as depression.  The patient presented to the [facility] emergency department on 12/4 with suicidal ideation.  The patient had had a sobriety of approximately 1-1/2 years.  Recently his grandmother had become acutely ill, and then died.  He stated that she died the June 05, 2022 prior to admission.  He stated that he had relapsed 2-1/2 weeks ago, and her acute illness led to his frustration.  The patient had a history of one previous suicide attempt approximately 4 years ago by overdosing on heroin.  He relapsed on ketamine as well as opiates.  He also been drinking alcohol and had also taken Valium.  His blood alcohol on admission was 97, and his drug screen was positive for benzodiazepines.  [facility] psychiatric [facility] in [facility].  He felt as though he would cut himself in a suicide attempt if he had been discharged.  He was admitted to the hospital for evaluation and stabilization.  Associated Signs/Symptoms: Depression Symptoms:  depressed mood, anhedonia, insomnia, psychomotor agitation, fatigue, feelings of worthlessness/guilt, difficulty concentrating, hopelessness, suicidal thoughts without plan, anxiety, (Hypo) Manic Symptoms:  Impulsivity, Labiality of Mood, Anxiety Symptoms:  Excessive Worry, Psychotic Symptoms:  Denied PTSD Symptoms: Negative Total Time spent with patient: 30 minutes  Past Psychiatric History: Patient has a history of depression,  anxiety as well as polysubstance dependence.  His last psychiatric hospitalization at our facility was 01/17/2016.  He was diagnosed with substance-induced mood disorder, depression and polysubstance dependence at that time.  Is the patient at risk to self? Yes.    Has the patient been a risk to self in the past 6 months? No.  Has the patient been a risk to self within the distant past? Yes.    Is the patient a risk to others? No.  Has the patient been a risk to others in the past 6 months? No.  Has the patient been a risk to others within the distant past? No.   Prior Inpatient Therapy:   Prior Outpatient Therapy:    Alcohol Screening: 1. How often do you have a drink containing alcohol?: 4 or more times a week 2. How many drinks containing alcohol do you have on a typical day when you are drinking?: 7, 8, or 9 3. How often do you have six or more drinks on one occasion?: Weekly AUDIT-C Score: 10 4. How often during the last year have you found that you were not able to stop drinking once you had started?: Weekly 5. How often during the last year have you failed to do what was normally expected from you becasue of drinking?: Less than monthly 6. How often during the last year have you needed a first drink in the morning to get yourself going after a heavy drinking session?: Monthly 7. How often during the last year have you had a feeling of guilt of remorse after drinking?: Weekly 8. How often during the last year have you been unable to remember what happened the night before because you had been drinking?: Monthly 9. Have you or someone else  been injured as a result of your drinking?: No 10. Has a relative or friend or a doctor or another health worker been concerned about your drinking or suggested you cut down?: Yes, but not in the last year Alcohol Use Disorder Identification Test Final Score (AUDIT): 23 Intervention/Follow-up: Alcohol Education Substance Abuse History in the last 12  months:  Yes.   Consequences of Substance Abuse: Withdrawal Symptoms:   Cramps Diaphoresis Diarrhea Headaches Nausea Tremors Vomiting Previous Psychotropic Medications: Yes  Psychological Evaluations: Yes  Past Medical History:  Past Medical History:  Diagnosis Date  . Hypertension   . Lupus (HCC)   . Rheumatoid arthritis (HCC)   . Seizures (HCC)    per patient   History reviewed. No pertinent surgical history. Family History: History reviewed. No pertinent family history. Family Psychiatric  History: Father with alcohol abuse and schizophrenia. Tobacco Screening: Have you used any form of tobacco in the last 30 days? (Cigarettes, Smokeless Tobacco, Cigars, and/or Pipes): Yes Tobacco use, Select all that apply: 5 or more cigarettes per day Are you interested in Tobacco Cessation Medications?: Yes, will notify MD for an order Counseled patient on smoking cessation including recognizing danger situations, developing coping skills and basic information about quitting provided: Refused/Declined practical counseling Social History:  Social History   Substance and Sexual Activity  Alcohol Use Yes  . Alcohol/week: 6.0 - 8.0 standard drinks  . Types: 6 - 8 Cans of beer per week   Comment: half gal liquor on the weekends     Social History   Substance and Sexual Activity  Drug Use Yes  . Types: Cocaine, Marijuana, Methamphetamines   Comment: suboxone    Additional Social History:      Pain Medications: See home med list Prescriptions: See home med list Over the Counter:  See home med list History of alcohol / drug use?: Yes Negative Consequences of Use: Financial, Personal relationships, Work / School Name of Substance 1: morphine 1 - Amount (size/oz): "one vial" 1 - Frequency: 3-4 times a week 1 - Last Use / Amount: 01/05/18 Name of Substance 2: ketamine 2 - Amount (size/oz): "one vial" 2 - Frequency: 3-4 times a week 2 - Last Use / Amount: 01/05/18 Name of Substance  3: alcohol 3 - Amount (size/oz): 6-8 beers and half gal of liquor  3 - Frequency: beer during the week and liquor on weekends 3 - Last Use / Amount: 01/08/18              Allergies:  No Known Allergies Lab Results:  Results for orders placed or performed during the hospital encounter of 01/08/18 (from the past 48 hour(s))  Rapid urine drug screen (hospital performed)     Status: Abnormal   Collection Time: 01/08/18  1:57 PM  Result Value Ref Range   Opiates NONE DETECTED NONE DETECTED   Cocaine NONE DETECTED NONE DETECTED   Benzodiazepines POSITIVE (A) NONE DETECTED   Amphetamines NONE DETECTED NONE DETECTED   Tetrahydrocannabinol NONE DETECTED NONE DETECTED   Barbiturates NONE DETECTED NONE DETECTED    Comment: (NOTE) DRUG SCREEN FOR MEDICAL PURPOSES ONLY.  IF CONFIRMATION IS NEEDED FOR ANY PURPOSE, NOTIFY LAB WITHIN 5 DAYS. LOWEST DETECTABLE LIMITS FOR URINE DRUG SCREEN Drug Class                     Cutoff (ng/mL) Amphetamine and metabolites    1000 Barbiturate and metabolites    200 Benzodiazepine  200 Tricyclics and metabolites     300 Opiates and metabolites        300 Cocaine and metabolites        300 THC                            50 Performed at [facility], [address], [place], [place] [address]   Ethanol     Status: Abnormal   Collection Time: 01/08/18  2:07 PM  Result Value Ref Range   Alcohol, Ethyl (B) 97 (H) <10 mg/dL    Comment: (NOTE) Lowest detectable limit for serum alcohol is 10 mg/dL. For medical purposes only. Performed at [facility], [address] [place], [place] [address]   Salicylate level     Status: None   Collection Time: 01/08/18  2:07 PM  Result Value Ref Range   Salicylate Lvl <7.0 2.8 - 30.0 mg/dL    Comment: Performed at [facility], [address], [place], [place] [address]  Acetaminophen level     Status: Abnormal   Collection Time: 01/08/18  2:07 PM  Result Value Ref Range    Acetaminophen (Tylenol), Serum <10 (L) 10 - 30 ug/mL    Comment: (NOTE) Therapeutic concentrations vary significantly. A range of 10-30 ug/mL  may be an effective concentration for many patients. However, some  are best treated at concentrations outside of this range. Acetaminophen concentrations >150 ug/mL at 4 hours after ingestion  and >50 ug/mL at 12 hours after ingestion are often associated with  toxic reactions. Performed at [facility], [address], [place], [place] [address]   Comprehensive metabolic panel     Status: Abnormal   Collection Time: 01/08/18  2:19 PM  Result Value Ref Range   Sodium 142 135 - 145 mmol/L   Potassium 3.2 (L) 3.5 - 5.1 mmol/L   Chloride 109 98 - 111 mmol/L   CO2 22 22 - 32 mmol/L   Glucose, Bld 85 70 - 99 mg/dL   BUN <5 (L) 6 - 20 mg/dL   Creatinine, Ser 2.95 0.61 - 1.24 mg/dL   Calcium 8.7 (L) 8.9 - 10.3 mg/dL   Total Protein 6.9 6.5 - 8.1 g/dL   Albumin 3.7 3.5 - 5.0 g/dL   AST 76 (H) 15 - 41 U/L   ALT 120 (H) 0 - 44 U/L   Alkaline Phosphatase 80 38 - 126 U/L   Total Bilirubin 0.5 0.3 - 1.2 mg/dL   GFR calc non Af Amer >60 >60 mL/min   GFR calc Af Amer >60 >60 mL/min   Anion gap 11 5 - 15    Comment: Performed at [facility], [address] [place], [place] [address]  cbc     Status: None   Collection Time: 01/08/18  2:19 PM  Result Value Ref Range   WBC 8.3 4.0 - 10.5 K/uL   RBC 4.56 4.22 - 5.81 MIL/uL   Hemoglobin 13.1 13.0 - 17.0 g/dL   HCT 86.5 78.4 - 69.6 %   MCV 91.9 80.0 - 100.0 fL   MCH 28.7 26.0 - 34.0 pg   MCHC 31.3 30.0 - 36.0 g/dL   RDW 29.5 28.4 - 13.2 %   Platelets 235 150 - 400 K/uL   nRBC 0.0 0.0 - 0.2 %    Comment: Performed at [facility], [address] [place], [place] [address]  I-stat troponin, ED     Status: None   Collection Time: 01/08/18  4:12 PM  Result Value Ref Range   Troponin i, poc 0.01 0.00 - 0.08 ng/mL   Comment 3            Comment: Due to the release kinetics of  cTnI, a negative result within the first hours of the onset of symptoms does not rule out myocardial infarction with certainty. If myocardial infarction is still suspected, repeat the test at appropriate intervals.   I-Stat CG4 Lactic Acid, ED     Status: None   Collection Time: 01/08/18  4:14 PM  Result Value Ref Range   Lactic Acid, Venous 1.38 0.5 - 1.9 mmol/L    Blood Alcohol level:  Lab Results  Component Value Date   ETH 97 (H) 01/08/2018   ETH 137 (H) 11/27/2017    Metabolic Disorder Labs:  No results found for: HGBA1C, MPG No results found for: PROLACTIN No results found for: CHOL, TRIG, HDL, CHOLHDL, VLDL, LDLCALC  Current Medications: Current Facility-Administered Medications  Medication Dose Route Frequency Provider Last Rate Last Dose  . acetaminophen (TYLENOL) tablet 650 mg  650 mg Oral Q6H PRN Antonieta Pert, MD      . FLUoxetine (PROZAC) capsule 40 mg  40 mg Oral Daily Antonieta Pert, MD   40 mg at 01/09/18 0840  . gabapentin (NEURONTIN) capsule 800 mg  800 mg Oral TID Antonieta Pert, MD   800 mg at 01/09/18 1122  . hydrOXYzine (ATARAX/VISTARIL) tablet 25 mg  25 mg Oral TID PRN Antonieta Pert, MD   25 mg at 01/09/18 1255  . LORazepam (ATIVAN) tablet 1 mg  1 mg Oral Q6H PRN Antonieta Pert, MD      . magnesium hydroxide (MILK OF MAGNESIA) suspension 5 mL  5 mL Oral QHS PRN Antonieta Pert, MD      . metoprolol succinate (TOPROL-XL) 24 hr tablet 100 mg  100 mg Oral Daily Antonieta Pert, MD   100 mg at 01/09/18 0840  . nicotine (NICODERM CQ - dosed in mg/24 hours) patch 21 mg  21 mg Transdermal Daily Donell Sievert E, PA-C   21 mg at 01/09/18 0801  . pantoprazole (PROTONIX) EC tablet 40 mg  40 mg Oral Daily Armandina Stammer I, NP   40 mg at 01/09/18 1122   PTA Medications: Medications Prior to Admission  Medication Sig Dispense Refill Last Dose  . cloNIDine (CATAPRES) 0.1 MG tablet Take 1 tablet (0.1 mg total) by mouth 2 (two) times daily.  (Patient not taking: Reported on 01/08/2018) 28 tablet 0 Not Taking at Unknown time  . FLUoxetine (PROZAC) 40 MG capsule Take 40 mg by mouth daily.   01/08/2018 at Unknown time  . gabapentin (NEURONTIN) 400 MG capsule Take 2 capsules (800 mg total) by mouth 3 (three) times daily. 60 capsule 0 01/08/2018 at Unknown time  . loratadine (CLARITIN) 10 MG tablet Take 1 tablet (10 mg total) by mouth daily. (Patient not taking: Reported on 01/08/2018)   Not Taking at Unknown time  . sertraline (ZOLOFT) 100 MG tablet Take 1 tablet (100 mg total) by mouth daily. (Patient not taking: Reported on 01/08/2018)   Not Taking at Unknown time    Musculoskeletal: Strength & Muscle Tone: within normal limits Gait & Station: normal Patient leans: N/A  Psychiatric Specialty Exam: Physical Exam  Nursing note and vitals reviewed. Constitutional: He is oriented to person, place, and time. He  appears well-developed and well-nourished.  HENT:  Head: Normocephalic and atraumatic.  Respiratory: Effort normal.  Neurological: He is alert and oriented to person, place, and time.    ROS  Blood pressure 119/74, pulse 78, temperature (!) 97.5 F (36.4 C), temperature source Oral, resp. rate 17, height 5' 9.5" (1.765 m), weight 83.9 kg.Body mass index is 26.93 kg/m.  General Appearance: Disheveled  Eye Contact:  Fair  Speech:  Normal Rate  Volume:  Decreased  Mood:  Anxious and Depressed  Affect:  Congruent  Thought Process:  Coherent and Descriptions of Associations: Intact  Orientation:  Full (Time, Place, and Person)  Thought Content:  Logical  Suicidal Thoughts:  Yes.  without intent/plan  Homicidal Thoughts:  No  Memory:  Immediate;   Fair Recent;   Fair Remote;   Fair  Judgement:  Intact  Insight:  Fair  Psychomotor Activity:  Increased  Concentration:  Concentration: Fair and Attention Span: Fair  Recall:  [employer] of Knowledge:  Fair  Language:  Fair  Akathisia:  Negative  Handed:  Right  AIMS (if  indicated):     Assets:  Communication Skills Desire for Improvement Financial Resources/Insurance Housing Intimacy Leisure Time Physical Health Resilience Social Support  ADL's:  Intact  Cognition:  WNL  Sleep:  Number of Hours: 1.5    Treatment Plan Summary: Daily contact with patient to assess and evaluate symptoms and progress in treatment, Medication management and Plan : Patient is seen and examined.  Patient is a 42 year old male with a past psychiatric history significant for polysubstance dependence, history of major depression, and recent grief issues after the acute illness and death of his grandmother.  He will be admitted to the hospital.  He will be integrated into the milieu.  He will be encouraged to attend groups to work on his grieving issues.  He had previously been treated with fluoxetine 40 mg a day, and this will be continued to we will reassess what his mood does all substances.  He will also be restarted on his gabapentin 800 mg p.o. 3 times daily.  He has history of hypertension, and it appears as though he has been on metoprolol XL 200 mg p.o. daily.  The patient denied any recent history of cocaine usage.  His blood pressure this morning is 119/74, and heart rate is 78.  He does not appear to be in significant withdrawal at least at this point.  There will be benzodiazepines available for CIWA greater than 10.  His potassium is slightly low, that will be supplemented.  His liver function enzymes are elevated with an AST of 76, and an ALT of 120.  He denied any history of HIV or hepatitis.  He did state that he had been using the ketamine IV.  I suggested that we check him for hepatitis as well as HIV.  He has agreed to that.  Observation Level/Precautions:  Detox 15 minute checks Seizure  Laboratory:  Chemistry Profile  Psychotherapy:    Medications:    Consultations:    Discharge Concerns:    Estimated LOS:  Other:     Physician Treatment Plan for Primary  Diagnosis: <principal problem not specified> Long Term Goal(s): Improvement in symptoms so as ready for discharge  Short Term Goals: Ability to identify changes in lifestyle to reduce recurrence of condition will improve, Ability to verbalize feelings will improve, Ability to disclose and discuss suicidal ideas, Ability to demonstrate self-control will improve, Ability to identify and develop  effective coping behaviors will improve, Ability to maintain clinical measurements within normal limits will improve and Ability to identify triggers associated with substance abuse/mental health issues will improve  Physician Treatment Plan for Secondary Diagnosis: Active Problems:   MDD (major depressive disorder), severe (HCC)  Long Term Goal(s): Improvement in symptoms so as ready for discharge  Short Term Goals: Ability to identify changes in lifestyle to reduce recurrence of condition will improve, Ability to verbalize feelings will improve, Ability to disclose and discuss suicidal ideas, Ability to demonstrate self-control will improve, Ability to identify and develop effective coping behaviors will improve, Ability to maintain clinical measurements within normal limits will improve and Ability to identify triggers associated with substance abuse/mental health issues will improve  I certify that inpatient services furnished can reasonably be expected to improve the patient's condition.    Antonieta Pert, MD 12/5/201912:59 PM

## 2018-01-10 LAB — HEPATIC FUNCTION PANEL
ALK PHOS: 74 U/L (ref 38–126)
ALT: 120 U/L — ABNORMAL HIGH (ref 0–44)
AST: 71 U/L — ABNORMAL HIGH (ref 15–41)
Albumin: 3.5 g/dL (ref 3.5–5.0)
Bilirubin, Direct: <0.1 mg/dL (ref 0.0–0.2)
Total Bilirubin: 0.5 mg/dL (ref 0.3–1.2)
Total Protein: 6.5 g/dL (ref 6.5–8.1)

## 2018-01-10 LAB — HEPATITIS PANEL, ACUTE
HCV Ab: >11 s/co ratio — ABNORMAL HIGH (ref 0.0–0.9)
Hep A IgM: NEGATIVE
Hep B C IgM: NEGATIVE
Hepatitis B Surface Ag: NEGATIVE

## 2018-01-10 MED ORDER — PANTOPRAZOLE SODIUM 20 MG PO TBEC
20.0000 mg | DELAYED_RELEASE_TABLET | Freq: Once | ORAL | Status: AC
  Administered 2018-01-10: 20 mg via ORAL
  Filled 2018-01-10 (×2): qty 1

## 2018-01-10 MED ORDER — KETOROLAC TROMETHAMINE 10 MG PO TABS
10.0000 mg | ORAL_TABLET | Freq: Three times a day (TID) | ORAL | Status: DC | PRN
  Administered 2018-01-10 – 2018-01-11 (×2): 10 mg via ORAL
  Filled 2018-01-10 (×2): qty 1

## 2018-01-10 NOTE — Progress Notes (Signed)
[facility] MD Progress Note  01/10/2018 11:02 AM Kenneth Elliott  MRN:  [id number] Subjective:  Patient is seen and examined. Patient is a 42 year old male with a past psychiatric history significant for polysubstance dependence as well as depression. The patient presented to the [facility] emergency department on 12/4 with suicidal ideation  Objective: Patient is seen and examined.  Patient's 42 year old male with the above-stated past psychiatric history seen in follow-up.  He is doing better today.  He is not having any suicidal ideation.  He was curious about the mirtazapine, and we discussed its role in depression and sedation for sleep.  He stated he slept well with that.  No withdrawal syndromes are noted.  His blood pressure is mildly elevated today at 132/91, pulse is 66.  Nursing notes reflect that he slept 6 hours last night.  Review of his laboratories again revealed elevation of his AST and ALT.  We discussed repeating those.  Principal Problem: <principal problem not specified> Diagnosis: Active Problems:   MDD (major depressive disorder), severe (HCC)  Total Time spent with patient: 20 minutes  Past Psychiatric History: See admission H&P  Past Medical History:  Past Medical History:  Diagnosis Date  . Hypertension   . Lupus (HCC)   . Rheumatoid arthritis (HCC)   . Seizures (HCC)    per patient   History reviewed. No pertinent surgical history. Family History: History reviewed. No pertinent family history. Family Psychiatric  History: See admission H&P Social History:  Social History   Substance and Sexual Activity  Alcohol Use Yes  . Alcohol/week: 6.0 - 8.0 standard drinks  . Types: 6 - 8 Cans of beer per week   Comment: half gal liquor on the weekends     Social History   Substance and Sexual Activity  Drug Use Yes  . Types: Cocaine, Marijuana, Methamphetamines   Comment: suboxone    Social History   Socioeconomic History  . Marital status:  Divorced    Spouse name: Not on file  . Number of children: Not on file  . Years of education: Not on file  . Highest education level: Not on file  Occupational History  . Not on file  Social Needs  . Financial resource strain: Not on file  . Food insecurity:    Worry: Not on file    Inability: Not on file  . Transportation needs:    Medical: Not on file    Non-medical: Not on file  Tobacco Use  . Smoking status: Current Every Day Smoker    Packs/day: 1.50    Types: Cigarettes  . Smokeless tobacco: Never Used  Substance and Sexual Activity  . Alcohol use: Yes    Alcohol/week: 6.0 - 8.0 standard drinks    Types: 6 - 8 Cans of beer per week    Comment: half gal liquor on the weekends  . Drug use: Yes    Types: Cocaine, Marijuana, Methamphetamines    Comment: suboxone  . Sexual activity: Yes    Partners: Female    Birth control/protection: None  Lifestyle  . Physical activity:    Days per week: Not on file    Minutes per session: Not on file  . Stress: Not on file  Relationships  . Social connections:    Talks on phone: Not on file    Gets together: Not on file    Attends religious service: Not on file    Active member of club or organization: Not on  file    Attends meetings of clubs or organizations: Not on file    Relationship status: Not on file  Other Topics Concern  . Not on file  Social History Narrative  . Not on file   Additional Social History:    Pain Medications: See home med list Prescriptions: See home med list Over the Counter:  See home med list History of alcohol / drug use?: Yes Negative Consequences of Use: Financial, Personal relationships, Work / School Name of Substance 1: morphine 1 - Amount (size/oz): "one vial" 1 - Frequency: 3-4 times a week 1 - Last Use / Amount: 01/05/18 Name of Substance 2: ketamine 2 - Amount (size/oz): "one vial" 2 - Frequency: 3-4 times a week 2 - Last Use / Amount: 01/05/18 Name of Substance 3: alcohol 3 -  Amount (size/oz): 6-8 beers and half gal of liquor  3 - Frequency: beer during the week and liquor on weekends 3 - Last Use / Amount: 01/08/18              Sleep: Fair  Appetite:  Good  Current Medications: Current Facility-Administered Medications  Medication Dose Route Frequency Provider Last Rate Last Dose  . FLUoxetine (PROZAC) capsule 40 mg  40 mg Oral Daily Antonieta Pert, MD   40 mg at 01/10/18 0806  . gabapentin (NEURONTIN) capsule 800 mg  800 mg Oral TID Antonieta Pert, MD   800 mg at 01/10/18 7116  . hydrOXYzine (ATARAX/VISTARIL) tablet 25 mg  25 mg Oral TID PRN Antonieta Pert, MD   25 mg at 01/10/18 5790  . ibuprofen (ADVIL,MOTRIN) tablet 600 mg  600 mg Oral Q6H PRN Kerry Hough, PA-C   600 mg at 01/09/18 2119  . loperamide (IMODIUM) capsule 2 mg  2 mg Oral Q8H PRN Armandina Stammer I, NP   2 mg at 01/09/18 1620  . LORazepam (ATIVAN) tablet 1 mg  1 mg Oral Q6H PRN Antonieta Pert, MD      . magnesium hydroxide (MILK OF MAGNESIA) suspension 5 mL  5 mL Oral QHS PRN Antonieta Pert, MD      . metoprolol succinate (TOPROL-XL) 24 hr tablet 100 mg  100 mg Oral Daily Antonieta Pert, MD   100 mg at 01/10/18 0806  . mirtazapine (REMERON) tablet 7.5 mg  7.5 mg Oral QHS Nwoko, Agnes I, NP   7.5 mg at 01/09/18 2111  . nicotine (NICODERM CQ - dosed in mg/24 hours) patch 21 mg  21 mg Transdermal Daily Donell Sievert E, PA-C   21 mg at 01/10/18 3833  . pantoprazole (PROTONIX) EC tablet 40 mg  40 mg Oral Daily Armandina Stammer I, NP   40 mg at 01/10/18 3832    Lab Results:  Results for orders placed or performed during the hospital encounter of 01/09/18 (from the past 48 hour(s))  Hepatitis panel, acute     Status: Abnormal   Collection Time: 01/09/18  6:38 PM  Result Value Ref Range   Hepatitis B Surface Ag Negative Negative   HCV Ab >11.0 (H) 0.0 - 0.9 s/co ratio    Comment: (NOTE)                                  Negative:     < 0.8  Indeterminate: 0.8 - 0.9                                  Positive:     > 0.9 The CDC recommends that a positive HCV antibody result be followed up with a HCV Nucleic Acid Amplification test (263335). Performed At: [facility] [address] [place], [place] [id number] Jolene Schimke MD HT:[id number]    Hep A IgM Negative Negative   Hep B C IgM Negative Negative    Blood Alcohol level:  Lab Results  Component Value Date   ETH 97 (H) 01/08/2018   ETH 137 (H) 11/27/2017    Metabolic Disorder Labs: No results found for: HGBA1C, MPG No results found for: PROLACTIN No results found for: CHOL, TRIG, HDL, CHOLHDL, VLDL, LDLCALC  Physical Findings: AIMS: Facial and Oral Movements Muscles of Facial Expression: None, normal Lips and Perioral Area: None, normal Jaw: None, normal Tongue: None, normal,Extremity Movements Upper (arms, wrists, hands, fingers): None, normal Lower (legs, knees, ankles, toes): None, normal, Trunk Movements Neck, shoulders, hips: None, normal, Overall Severity Severity of abnormal movements (highest score from questions above): None, normal Incapacitation due to abnormal movements: None, normal Patient's awareness of abnormal movements (rate only patient's report): No Awareness, Dental Status Current problems with teeth and/or dentures?: No Does patient usually wear dentures?: No  CIWA:  CIWA-Ar Total: 3 COWS:  COWS Total Score: 4  Musculoskeletal: Strength & Muscle Tone: within normal limits Gait & Station: normal Patient leans: N/A  Psychiatric Specialty Exam: Physical Exam  Nursing note and vitals reviewed. Constitutional: He is oriented to person, place, and time. He appears well-developed and well-nourished.  HENT:  Head: Normocephalic and atraumatic.  Respiratory: Effort normal.  Neurological: He is alert and oriented to person, place, and time.    ROS  Blood pressure (!) 132/91, pulse 66, temperature 97.7 F (36.5 C),  temperature source Oral, resp. rate 17, height 5' 9.5" (1.765 m), weight 83.9 kg.Body mass index is 26.93 kg/m.  General Appearance: Casual  Eye Contact:  Fair  Speech:  Normal Rate  Volume:  Normal  Mood:  Anxious  Affect:  Congruent  Thought Process:  Coherent and Descriptions of Associations: Intact  Orientation:  Full (Time, Place, and Person)  Thought Content:  Logical  Suicidal Thoughts:  No  Homicidal Thoughts:  No  Memory:  Immediate;   Fair Recent;   Fair Remote;   Fair  Judgement:  Intact  Insight:  Fair  Psychomotor Activity:  Increased  Concentration:  Concentration: Fair and Attention Span: Fair  Recall:  [employer] of Knowledge:  Fair  Language:  Good  Akathisia:  Negative  Handed:  Right  AIMS (if indicated):     Assets:  Communication Skills Desire for Improvement Financial Resources/Insurance Housing Intimacy Leisure Time Physical Health Resilience Social Support  ADL's:  Intact  Cognition:  WNL  Sleep:  Number of Hours: 5.75     Treatment Plan Summary: Daily contact with patient to assess and evaluate symptoms and progress in treatment, Medication management and Plan : Patient is seen and examined.  Patient is a 42 year old male with the above-stated past psychiatric history was seen in follow-up.  He continues to slowly improved.  His mood is improved, his anxiety is decreased.  He slept better with the Remeron.  No change in the Remeron or the Prozac.  If he continues to improve, we will consider discharge  in the next 1 to 2 days.  We will repeat his liver function enzymes to make sure that they have gone down.  He has hepatitis a is negative, B is negative, but his hepatitis C antibody is positive.  His HIV is still pending. 1.  Continue fluoxetine 40 mg p.o. daily for anxiety and depression. 2. Continue mirtazapine 7.5 mg p.o. nightly for depression, anxiety and sedation. 3.  Continue lorazepam 1 mg p.o. every 6 hours as needed anxiety, or CIWA  greater than 10. 4.  Continue hydroxyzine 25 mg p.o. 3 times daily as needed anxiety 5.  Continue gabapentin 800 mg p.o. 3 times daily for mood stability and chronic pain. 5.  Discharge planning-if everything continues to go well I anticipate discharge in 1 to 2 days.  Antonieta Pert, MD 01/10/2018, 11:02 AM

## 2018-01-10 NOTE — Progress Notes (Signed)
Pt attended AA group this evening.  

## 2018-01-10 NOTE — Progress Notes (Signed)
D: Pt was in bed in his room upon initial approach.  Pt presents with appropriate affect and anxious mood.  Pt describes his day as "good" and reports goal is to "get through tonight so I can leave tomorrow."  Pt reports he feels safe to discharge tomorrow.  Pt denies SI/HI, denies hallucinations, reports L middle and ring finger pain of 2/10.  Pt has been visible in milieu interacting with peers and staff appropriately.  Pt attended evening group.    A: Met with pt 1:1.  Actively listened to pt and offered support and encouragement. Medication administered per order.  PRN medication administered for anxiety.  Q15 minute safety checks maintained.  R: Pt is safe on the unit.  Pt is compliant with medications.  Pt verbally contracts for safety.  Will continue to monitor and assess.

## 2018-01-10 NOTE — Progress Notes (Signed)
Pt presents with an anxious mood. Pt reports decrease symptoms of depression and anxiety. Pt denies any withdrawal symptoms. Pt denies SI/HI. Pt reports fair sleep at bedtime. Pt verbalizes readiness for discharge tomorrow. Pt compliant with taking his medications and denies any side effects. Pt c/o indigestion. Protonix 20 mg given once after lunch per Nicole Kindred, NP.   Medications administered as ordered per MD. Verbal support provided. Pt encouraged to attend groups. 15 minute checks performed for safety.   Pt compliant with tx plan.

## 2018-01-10 NOTE — Progress Notes (Signed)
Adult Psychoeducational Group Note  Date:  01/10/2018 Time:  1:05 PM  Group Topic/Focus:  Goals Group:   The focus of this group is to help patients establish daily goals to achieve during treatment and discuss how the patient can incorporate goal setting into their daily lives to aide in recovery.  Participation Level:  Active  Participation Quality:  Appropriate  Affect:  Appropriate  Cognitive:  Appropriate  Insight: Appropriate  Engagement in Group:  Engaged  Modes of Intervention:  Discussion  Additional Comments:  Pt attended morning group.  Deforest Hoyles Lawerence Dery 01/10/2018, 1:05 PM

## 2018-01-10 NOTE — Progress Notes (Signed)
  [facility] Adult Case Management Discharge Plan :  Will you be returning to the same living situation after discharge:  Yes,  home At discharge, do you have transportation home?: Yes,  girlfriend/family member--PT IS SCHEDULED FOR DISCHARGE ON Saturday, 01/11/18 Do you have the ability to pay for your medications: Yes,  mental health  Release of information consent forms completed and submitted to medical records by CSW.   Patient to Follow up at: Follow-up Information    [facility]. Go on 01/17/2018.   Specialty:  Behavioral Health Why:  Your hospital follow up appointment is Friday, 01/17/18 at 8:30a.  Please bring: photo ID, proof of insurance, social security card, and any discharge paperwork from this hospitalization.  Contact information: [address] [place] [address] [phone]           Next level of care provider has access to [facility] Link:no  Safety Planning and Suicide Prevention discussed: Yes,  SPE completed with pt; pt declined to consent to collateral contact. SPI pamphlet and mobile crisis information provided to pt.   Have you used any form of tobacco in the last 30 days? (Cigarettes, Smokeless Tobacco, Cigars, and/or Pipes): Yes  Has patient been referred to the Quitline?: Patient refused referral  Patient has been referred for addiction treatment: Yes  Rona Ravens, LCSW 01/10/2018, 10:02 AM

## 2018-01-11 LAB — HIV-1 RNA, QUALITATIVE, TMA: HIV-1 RNA, Qualitative, TMA: NEGATIVE

## 2018-01-11 MED ORDER — METOPROLOL SUCCINATE ER 100 MG PO TB24: 100.0000 mg | ORAL_TABLET | Freq: Every day | ORAL | 0 refills | Status: DC

## 2018-01-11 MED ORDER — FLUOXETINE HCL 40 MG PO CAPS: 40.0000 mg | ORAL_CAPSULE | Freq: Every day | ORAL | 0 refills | Status: DC

## 2018-01-11 MED ORDER — MIRTAZAPINE 7.5 MG PO TABS: 7.5000 mg | ORAL_TABLET | Freq: Every day | ORAL | 0 refills | Status: DC

## 2018-01-11 MED ORDER — NICOTINE 21 MG/24HR TD PT24: 21.0000 mg | MEDICATED_PATCH | Freq: Every day | TRANSDERMAL | 0 refills | Status: DC

## 2018-01-11 NOTE — BHH Suicide Risk Assessment (Signed)
[facility] Discharge Suicide Risk Assessment   Principal Problem: <principal problem not specified> Discharge Diagnoses: Active Problems:   MDD (major depressive disorder), severe (HCC)   Total Time spent with patient: 30 minutes  Musculoskeletal: Strength & Muscle Tone: within normal limits Gait & Station: normal Patient leans: N/A  Psychiatric Specialty Exam: ROS no headache, no chest pain, no shortness of breath, no nausea, no vomiting, no diarrhea  Blood pressure (!) 146/94, pulse 64, temperature 98.2 F (36.8 C), resp. rate 17, height 5' 9.5" (1.765 m), weight 83.9 kg.Body mass index is 26.93 kg/m.  General Appearance: Well Groomed  Eye Contact::  Good  Speech:  Normal Rate409  Volume:  Normal  Mood:  reports mood is " pretty good", denies feeling depressed, and presents euthymic  Affect:  Appropriate and reactive  Thought Process:  Linear and Descriptions of Associations: Intact  Orientation:  Full (Time, Place, and Person)  Thought Content:  no hallucinations, no delusions, not internally preoccupied  Suicidal Thoughts:  No denies suicidal or self injurious ideations, denies homicidal or violent ideations  Homicidal Thoughts:  No  Memory:  recent and remote grossly intact   Judgement:  Other:  improving  Insight:  improving   Psychomotor Activity:  Normal  Concentration:  Good  Recall:  Good  Fund of Knowledge:Good  Language: Good  Akathisia:  Negative  Handed:  Left  AIMS (if indicated):     Assets:  Communication Skills Desire for Improvement Resilience  Sleep:  Number of Hours: 6.25  Cognition: WNL  ADL's:  Intact   Mental Status Per Nursing Assessment::   On Admission:  Suicidal ideation indicated by patient  Demographic Factors:  42, single, has a 35 y old daughter, lives with GF.  Marland Kitchen  Loss Factors: Reports he had been off medications for several weeks due to insurance constraints . Grandmother passed away recently. Recent relapse.  Historical  Factors: One prior psychiatric admission for substance abuse in 2017. History of depression,anxiety. Denies history of suicide attempts .  Risk Reduction Factors:   Living with another person, especially a relative, Positive social support and Positive coping skills or problem solving skills  Continued Clinical Symptoms:  At this time alert, attentive, well related, calm, in no acute distress, reports improved mood, and presents euthymic, with appropriate /full range of affect, no thought disorder, no SI, no HI, no psychotic symptoms, future oriented .  Denies medication side effects. At this time presents calm, pleasant on approach. We reviewed labs- LFTs elevated and Hep C (+) - patient aware. No associated symptoms/asymptomatic. We reviewed importance of outpatient follow up, management.  Cognitive Features That Contribute To Risk:  No gross cognitive deficits noted upon discharge. Is alert , attentive, and oriented x 3    Suicide Risk:  Mild:  Suicidal ideation of limited frequency, intensity, duration, and specificity.  There are no identifiable plans, no associated intent, mild dysphoria and related symptoms, good self-control (both objective and subjective assessment), few other risk factors, and identifiable protective factors, including available and accessible social support.  Follow-up Information    [facility]. Go on 01/17/2018.   Specialty:  Behavioral Health Why:  Your hospital follow up appointment is Friday, 01/17/18 at 8:30a.  Please bring: photo ID, proof of insurance, social security card, and any discharge paperwork from this hospitalization.  Contact information: [address] [place] [address] [phone]           Plan Of Care/Follow-up recommendations:  Activity:  as tolerated  Diet:  regular Tests:  NA Other:  See below  Patient is expressing readiness for discharge, leaving unit in good spirits  Plans to return home Follow up as above  Recommended  to follow up with PCP/ ID Specialist for monitoring and management of Hep C  Craige Cotta, MD 01/11/2018, 11:48 AM
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
## 2018-01-11 NOTE — Discharge Summary (Addendum)
Physician Discharge Summary Note  Patient:  Kenneth Elliott is an 42 y.o., male MRN:  [id number] DOB:  Jun 25, 1975 Patient phone:  [phone] (home)  Patient address:   [address] [place] [address],  Total Time spent with patient: 15 minutes  Date of Admission:  01/09/2018 Date of Discharge: 120/08/2017  Reason for Admission: Per admission assessment note:  Patient is a 42 year old male with a past psychiatric history significant for polysubstance dependence as well as depression. The patient presented to the [facility] emergency department on 12/4 with suicidal ideation. The patient had had a sobriety of approximately 1-1/2 years. Recently his grandmother had become acutely ill, and then died. He stated that she died the 05/31/22 prior to admission. He stated that he had relapsed 2-1/2 weeks ago, and her acute illness led to his frustration. The patient had a history of one previous suicide attempt approximately 4 years ago by overdosing on heroin. He relapsed on ketamine as well as opiates. He also been drinking alcohol and had also taken Valium. His blood alcohol on admission was 97, and his drug screen was positive for benzodiazepines. His last psychiatric hospitalization was at behavioral [facility] in [facility]/31/2015. He felt as though he would cut himself in a suicide attempt if he had been discharged. He was admitted to the hospital for evaluation and stabilization  Principal Problem: MDD (major depressive disorder), severe (HCC) Discharge Diagnoses: Principal Problem:   MDD (major depressive disorder), severe (HCC)   Past Psychiatric History: Per assessment note: Patient has a history of depression, anxiety as well as polysubstance dependence.  His last psychiatric hospitalization at our facility was 01/17/2016.  He was diagnosed with substance-induced mood disorder, depression and polysubstance dependence at that time.   Past Medical History:  Past  Medical History:  Diagnosis Date  . Hypertension   . Lupus (HCC)   . Rheumatoid arthritis (HCC)   . Seizures (HCC)    per patient   History reviewed. No pertinent surgical history. Family History: History reviewed. No pertinent family history. Family Psychiatric  History:  Social History:  Social History   Substance and Sexual Activity  Alcohol Use Yes  . Alcohol/week: 6.0 - 8.0 standard drinks  . Types: 6 - 8 Cans of beer per week   Comment: half gal liquor on the weekends     Social History   Substance and Sexual Activity  Drug Use Yes  . Types: Cocaine, Marijuana, Methamphetamines   Comment: suboxone    Social History   Socioeconomic History  . Marital status: Divorced    Spouse name: Not on file  . Number of children: Not on file  . Years of education: Not on file  . Highest education level: Not on file  Occupational History  . Not on file  Social Needs  . Financial resource strain: Not on file  . Food insecurity:    Worry: Not on file    Inability: Not on file  . Transportation needs:    Medical: Not on file    Non-medical: Not on file  Tobacco Use  . Smoking status: Current Every Day Smoker    Packs/day: 1.50    Types: Cigarettes  . Smokeless tobacco: Never Used  Substance and Sexual Activity  . Alcohol use: Yes    Alcohol/week: 6.0 - 8.0 standard drinks    Types: 6 - 8 Cans of beer per week    Comment: half gal liquor on the weekends  . Drug use:  Yes    Types: Cocaine, Marijuana, Methamphetamines    Comment: suboxone  . Sexual activity: Yes    Partners: Female    Birth control/protection: None  Lifestyle  . Physical activity:    Days per week: Not on file    Minutes per session: Not on file  . Stress: Not on file  Relationships  . Social connections:    Talks on phone: Not on file    Gets together: Not on file    Attends religious service: Not on file    Active member of club or organization: Not on file    Attends meetings of clubs or  organizations: Not on file    Relationship status: Not on file  Other Topics Concern  . Not on file  Social History Narrative  . Not on file    Hospital Course:  Kenneth Elliott was admitted for MDD (major depressive disorder), severe (HCC)and crisis management.  Pt was treated discharged with the medications listed below under Medication List.  Medical problems were identified and treated as needed.  Home medications were restarted as appropriate.  Improvement was monitored by observation and Kenneth Elliott 's daily report of symptom reduction.  Emotional and mental status was monitored by daily self-inventory reports completed by Kenneth Elliott and clinical staff.         Kenneth Elliott was evaluated by the treatment team for stability and plans for continued recovery upon discharge. Kenneth Elliott 's motivation was an integral factor for scheduling further treatment. Employment, transportation, bed availability, health status, family support, and any pending legal issues were also considered during hospital stay. Pt was offered further treatment options upon discharge including but not limited to Residential, Intensive Outpatient, and Outpatient treatment.  Kenneth Elliott will follow up with the services as listed below under Follow Up Information.     Upon completion of this admission the patient was both mentally and medically stable for discharge denying suicidal/homicidal ideation, auditory/visual/tactile hallucinations, delusional thoughts and paranoia.    Kenneth Elliott responded well to treatment with Prozac 40 mg, Neurontin 800 mg and Remeron 7.5mg  without adverse effects. Pt demonstrated improvement without reported or observed adverse effects to the point of stability appropriate for outpatient management. Pertinent labs include: Hepatitis CMP, CBC and Rapid urine drug screen , for which outpatient follow-up is necessary for lab recheck as mentioned below. Reviewed CBC, CMP,  BAL+137 on admission, and UDS+Benzodiazepines ; all unremarkable aside from noted exceptions.   Physical Findings: AIMS: Facial and Oral Movements Muscles of Facial Expression: None, normal Lips and Perioral Area: None, normal Jaw: None, normal Tongue: None, normal,Extremity Movements Upper (arms, wrists, hands, fingers): None, normal Lower (legs, knees, ankles, toes): None, normal, Trunk Movements Neck, shoulders, hips: None, normal, Overall Severity Severity of abnormal movements (highest score from questions above): None, normal Incapacitation due to abnormal movements: None, normal Patient's awareness of abnormal movements (rate only patient's report): No Awareness, Dental Status Current problems with teeth and/or dentures?: No Does patient usually wear dentures?: No  CIWA:  CIWA-Ar Total: 3 COWS:  COWS Total Score: 4  Musculoskeletal: Strength & Muscle Tone: within normal limits Gait & Station: normal Patient leans: N/A  Psychiatric Specialty Exam: SeeSRA by MD Physical Exam  Vitals reviewed. Constitutional: He appears well-developed.  Cardiovascular: Normal rate.  Neurological: He is alert.  Psychiatric: He has a normal mood and affect. His behavior is normal.    Review of Systems  Psychiatric/Behavioral: Negative for  depression (improving ) and suicidal ideas. The patient is not nervous/anxious.   All other systems reviewed and are negative.   Blood pressure (!) 146/94, pulse 64, temperature 98.2 F (36.8 C), resp. rate 17, height 5' 9.5" (1.765 m), weight 83.9 kg.Body mass index is 26.93 kg/m.   Have you used any form of tobacco in the last 30 days? (Cigarettes, Smokeless Tobacco, Cigars, and/or Pipes): Yes  Has this patient used any form of tobacco in the last 30 days? (Cigarettes, Smokeless Tobacco, Cigars, and/or Pipes)  No  Blood Alcohol level:  Lab Results  Component Value Date   ETH 97 (H) 01/08/2018   ETH 137 (H) 11/27/2017    Metabolic Disorder Labs:   No results found for: HGBA1C, MPG No results found for: PROLACTIN No results found for: CHOL, TRIG, HDL, CHOLHDL, VLDL, LDLCALC  See Psychiatric Specialty Exam and Suicide Risk Assessment completed by Attending Physician prior to discharge.  Discharge destination:  Home  Is patient on multiple antipsychotic therapies at discharge:  No   Has Patient had three or more failed trials of antipsychotic monotherapy by history:  No  Recommended Plan for Multiple Antipsychotic Therapies: NA  Discharge Instructions    Diet - low sodium heart healthy   Complete by:  As directed    Discharge instructions   Complete by:  As directed    Take all medications as prescribed. Keep all follow-up appointments as scheduled.  Do not consume alcohol or use illegal drugs while on prescription medications. Report any adverse effects from your medications to your primary care provider promptly.  In the event of recurrent symptoms or worsening symptoms, call 911, a crisis hotline, or go to the nearest emergency department for evaluation.   Increase activity slowly   Complete by:  As directed      Allergies as of 01/11/2018   No Known Allergies     Medication List    STOP taking these medications   cloNIDine 0.1 MG tablet Commonly known as:  CATAPRES   loratadine 10 MG tablet Commonly known as:  CLARITIN   sertraline 100 MG tablet Commonly known as:  ZOLOFT     TAKE these medications     Indication  FLUoxetine 40 MG capsule Commonly known as:  PROZAC Take 1 capsule (40 mg total) by mouth daily.  Indication:  Depression   gabapentin 400 MG capsule Commonly known as:  NEURONTIN Take 2 capsules (800 mg total) by mouth 3 (three) times daily.  Indication:  Agitation, Alcohol Withdrawal Syndrome   metoprolol succinate 100 MG 24 hr tablet Commonly known as:  TOPROL-XL Take 1 tablet (100 mg total) by mouth daily. Take with or immediately following a meal. Start taking on:  01/12/2018   Indication:  High Blood Pressure Disorder   mirtazapine 7.5 MG tablet Commonly known as:  REMERON Take 1 tablet (7.5 mg total) by mouth at bedtime.  Indication:  Major Depressive Disorder, Panic Disorder   nicotine 21 mg/24hr patch Commonly known as:  NICODERM CQ - dosed in mg/24 hours Place 1 patch (21 mg total) onto the skin daily. Start taking on:  01/12/2018  Indication:  Nicotine Addiction      Follow-up Information    [facility]. Go on 01/17/2018.   Specialty:  Behavioral Health Why:  Your hospital follow up appointment is Friday, 01/17/18 at 8:30a.  Please bring: photo ID, proof of insurance, social security card, and any discharge paperwork from this hospitalization.  Contact information: [address] [place]  [place] [address] [phone]        [facility] Follow up.   Why:  Your doctor recommends that you call this primary care provider about getting an appointment to follow up with your medical issues. Contact information: [address] [place] [address] [phone]       [facility]              Follow up.   Why:  Your doctor recommends that you contact this office to pursue treatment for your Hepatitis C diagnosis. Contact information: [address] [place] [address]          Follow-up recommendations:  Activity:  as tolerated Diet:  heart healthy  Comments:  Take all medications as prescribed. Keep all follow-up appointments as scheduled.  Do not consume alcohol or use illegal drugs while on prescription medications. Report any adverse effects from your medications to your primary care provider promptly.  In the event of recurrent symptoms or worsening symptoms, call 911, a crisis hotline, or go to the nearest emergency department for evaluation.   Signed: Oneta Rack, NP 01/11/2018, 1:26 PM   Patient seen, Suicide Assessment  Completed.  Disposition Plan Reviewed

## 2018-01-11 NOTE — Plan of Care (Signed)
  Problem: Safety: Goal: Periods of time without injury will increase Outcome: Progressing Note:  Pt has not harmed self or others tonight.  He denies SI/HI and verbally contracts for safety.   

## 2018-01-11 NOTE — Progress Notes (Signed)
Pt received both written and verbal discharge instructions. Pt verbalized understanding of discharge instructions. Pt agreed to f/u appt and med regimen. Pt received AVS, transitional record, SRA, suicide prevention, prescriptions and sample meds. Pt gathered belongings from room and locker. Pt safely discharged to the lobby.

## 2018-02-08 ENCOUNTER — Encounter (HOSPITAL_COMMUNITY): Payer: Self-pay | Admitting: Emergency Medicine

## 2018-02-08 ENCOUNTER — Encounter (HOSPITAL_COMMUNITY): Payer: Self-pay

## 2018-02-08 ENCOUNTER — Other Ambulatory Visit: Payer: Self-pay

## 2018-02-08 ENCOUNTER — Inpatient Hospital Stay (HOSPITAL_COMMUNITY)
Admission: AD | Admit: 2018-02-08 | Discharge: 2018-02-12 | DRG: 885 | Disposition: A | Discharge status: Home / Self Care | Payer: Federal, State, Local not specified - Other | Source: Intra-hospital | Attending: Psychiatry | Admitting: Psychiatry

## 2018-02-08 ENCOUNTER — Emergency Department (HOSPITAL_COMMUNITY)
Admission: EM | Admit: 2018-02-08 | Discharge: 2018-02-08 | Disposition: A | Discharge status: Home / Self Care | Payer: Self-pay | Attending: Emergency Medicine | Admitting: Emergency Medicine

## 2018-02-08 DIAGNOSIS — R45851 Suicidal ideations: Secondary | ICD-10-CM

## 2018-02-08 DIAGNOSIS — I10 Essential (primary) hypertension: Secondary | ICD-10-CM | POA: Insufficient documentation

## 2018-02-08 DIAGNOSIS — Z811 Family history of alcohol abuse and dependence: Secondary | ICD-10-CM | POA: Diagnosis not present

## 2018-02-08 DIAGNOSIS — F1910 Other psychoactive substance abuse, uncomplicated: Secondary | ICD-10-CM

## 2018-02-08 DIAGNOSIS — Z23 Encounter for immunization: Secondary | ICD-10-CM

## 2018-02-08 DIAGNOSIS — Z79899 Other long term (current) drug therapy: Secondary | ICD-10-CM | POA: Insufficient documentation

## 2018-02-08 DIAGNOSIS — M329 Systemic lupus erythematosus, unspecified: Secondary | ICD-10-CM | POA: Diagnosis present

## 2018-02-08 DIAGNOSIS — F17210 Nicotine dependence, cigarettes, uncomplicated: Secondary | ICD-10-CM | POA: Diagnosis present

## 2018-02-08 DIAGNOSIS — F322 Major depressive disorder, single episode, severe without psychotic features: Principal | ICD-10-CM | POA: Diagnosis present

## 2018-02-08 DIAGNOSIS — F1920 Other psychoactive substance dependence, uncomplicated: Secondary | ICD-10-CM | POA: Diagnosis present

## 2018-02-08 DIAGNOSIS — Z818 Family history of other mental and behavioral disorders: Secondary | ICD-10-CM

## 2018-02-08 DIAGNOSIS — M069 Rheumatoid arthritis, unspecified: Secondary | ICD-10-CM | POA: Diagnosis present

## 2018-02-08 DIAGNOSIS — G4700 Insomnia, unspecified: Secondary | ICD-10-CM | POA: Diagnosis present

## 2018-02-08 DIAGNOSIS — F1120 Opioid dependence, uncomplicated: Secondary | ICD-10-CM | POA: Insufficient documentation

## 2018-02-08 DIAGNOSIS — F314 Bipolar disorder, current episode depressed, severe, without psychotic features: Secondary | ICD-10-CM | POA: Insufficient documentation

## 2018-02-08 DIAGNOSIS — F1020 Alcohol dependence, uncomplicated: Secondary | ICD-10-CM | POA: Insufficient documentation

## 2018-02-08 HISTORY — DX: Major depressive disorder, single episode, unspecified: F32.9

## 2018-02-08 HISTORY — DX: Bipolar disorder, unspecified: F31.9

## 2018-02-08 HISTORY — DX: Depression, unspecified: F32.A

## 2018-02-08 LAB — COMPREHENSIVE METABOLIC PANEL
ALBUMIN: 4.1 g/dL (ref 3.5–5.0)
ALT: 113 U/L — ABNORMAL HIGH (ref 0–44)
AST: 76 U/L — ABNORMAL HIGH (ref 15–41)
Alkaline Phosphatase: 81 U/L (ref 38–126)
Anion gap: 10 (ref 5–15)
BUN: 10 mg/dL (ref 6–20)
CO2: 21 mmol/L — ABNORMAL LOW (ref 22–32)
CREATININE: 0.99 mg/dL (ref 0.61–1.24)
Calcium: 9.7 mg/dL (ref 8.9–10.3)
Chloride: 105 mmol/L (ref 98–111)
GFR calc Af Amer: >60 mL/min (ref >60)
GFR calc non Af Amer: >60 mL/min (ref >60)
GLUCOSE: 105 mg/dL — AB (ref 70–99)
Potassium: 3.7 mmol/L (ref 3.5–5.1)
Sodium: 136 mmol/L (ref 135–145)
Total Bilirubin: 0.7 mg/dL (ref 0.3–1.2)
Total Protein: 7.8 g/dL (ref 6.5–8.1)

## 2018-02-08 LAB — CBC
HCT: 42.3 % (ref 39.0–52.0)
Hemoglobin: 14.1 g/dL (ref 13.0–17.0)
MCH: 30.5 pg (ref 26.0–34.0)
MCHC: 33.3 g/dL (ref 30.0–36.0)
MCV: 91.6 fL (ref 80.0–100.0)
Platelets: 197 10*3/uL (ref 150–400)
RBC: 4.62 MIL/uL (ref 4.22–5.81)
RDW: 13.2 % (ref 11.5–15.5)
WBC: 11.6 10*3/uL — ABNORMAL HIGH (ref 4.0–10.5)
nRBC: 0 % (ref 0.0–0.2)

## 2018-02-08 LAB — RAPID URINE DRUG SCREEN, HOSP PERFORMED
AMPHETAMINES: NOT DETECTED
Barbiturates: NOT DETECTED
Benzodiazepines: NOT DETECTED
Cocaine: NOT DETECTED
Opiates: NOT DETECTED
TETRAHYDROCANNABINOL: NOT DETECTED

## 2018-02-08 LAB — ACETAMINOPHEN LEVEL: Acetaminophen (Tylenol), Serum: <10 ug/mL — ABNORMAL LOW (ref 10–30)

## 2018-02-08 LAB — SALICYLATE LEVEL: Salicylate Lvl: <7 mg/dL (ref 2.8–30.0)

## 2018-02-08 LAB — ETHANOL

## 2018-02-08 MED ORDER — ALUM & MAG HYDROXIDE-SIMETH 200-200-20 MG/5ML PO SUSP: 30.0000 mL | Freq: Four times a day (QID) | ORAL | Status: DC | PRN

## 2018-02-08 MED ORDER — ADULT MULTIVITAMIN W/MINERALS CH
1.0000 | ORAL_TABLET | Freq: Every day | ORAL | Status: DC
  Administered 2018-02-09 – 2018-02-12 (×5): 1 via ORAL
  Filled 2018-02-08 (×8): qty 1

## 2018-02-08 MED ORDER — THIAMINE HCL 100 MG/ML IJ SOLN: 100.0000 mg | Freq: Every day | INTRAMUSCULAR | Status: DC

## 2018-02-08 MED ORDER — ALUM & MAG HYDROXIDE-SIMETH 200-200-20 MG/5ML PO SUSP
30.0000 mL | ORAL | Status: DC | PRN
  Administered 2018-02-11 – 2018-02-12 (×3): 30 mL via ORAL
  Filled 2018-02-08 (×3): qty 30

## 2018-02-08 MED ORDER — MAGNESIUM HYDROXIDE 400 MG/5ML PO SUSP: 30.0000 mL | Freq: Every day | ORAL | Status: DC | PRN

## 2018-02-08 MED ORDER — INFLUENZA VAC SPLIT QUAD 0.5 ML IM SUSY
0.5000 mL | PREFILLED_SYRINGE | INTRAMUSCULAR | Status: AC
  Administered 2018-02-10: 0.5 mL via INTRAMUSCULAR
  Filled 2018-02-08: qty 0.5

## 2018-02-08 MED ORDER — GABAPENTIN 400 MG PO CAPS: 800.0000 mg | ORAL_CAPSULE | Freq: Three times a day (TID) | ORAL | Status: DC

## 2018-02-08 MED ORDER — VITAMIN B-1 100 MG PO TABS
100.0000 mg | ORAL_TABLET | Freq: Every day | ORAL | Status: DC
  Administered 2018-02-09 – 2018-02-12 (×4): 100 mg via ORAL
  Filled 2018-02-08 (×6): qty 1

## 2018-02-08 MED ORDER — LOPERAMIDE HCL 2 MG PO CAPS
2.0000 mg | ORAL_CAPSULE | ORAL | Status: AC | PRN
  Administered 2018-02-09: 4 mg via ORAL
  Filled 2018-02-08: qty 2

## 2018-02-08 MED ORDER — FLUOXETINE HCL 20 MG PO CAPS: 40.0000 mg | ORAL_CAPSULE | Freq: Every day | ORAL | Status: DC

## 2018-02-08 MED ORDER — TRAZODONE HCL 50 MG PO TABS
50.0000 mg | ORAL_TABLET | Freq: Every evening | ORAL | Status: DC | PRN
  Administered 2018-02-08: 50 mg via ORAL
  Filled 2018-02-08: qty 7
  Filled 2018-02-08: qty 1

## 2018-02-08 MED ORDER — LORAZEPAM 2 MG/ML IJ SOLN: 0.0000 mg | Freq: Two times a day (BID) | INTRAMUSCULAR | Status: DC

## 2018-02-08 MED ORDER — ONDANSETRON 4 MG PO TBDP
4.0000 mg | ORAL_TABLET | Freq: Four times a day (QID) | ORAL | Status: AC | PRN
  Administered 2018-02-08 – 2018-02-10 (×6): 4 mg via ORAL
  Filled 2018-02-08 (×6): qty 1

## 2018-02-08 MED ORDER — THIAMINE HCL 100 MG/ML IJ SOLN: 100.0000 mg | Freq: Once | INTRAMUSCULAR | Status: DC

## 2018-02-08 MED ORDER — ACETAMINOPHEN 325 MG PO TABS
650.0000 mg | ORAL_TABLET | Freq: Four times a day (QID) | ORAL | Status: DC | PRN
  Administered 2018-02-09 – 2018-02-12 (×4): 650 mg via ORAL
  Filled 2018-02-08 (×4): qty 2

## 2018-02-08 MED ORDER — LORAZEPAM 1 MG PO TABS
0.0000 mg | ORAL_TABLET | Freq: Four times a day (QID) | ORAL | Status: DC
  Administered 2018-02-08: 1 mg via ORAL
  Filled 2018-02-08: qty 1

## 2018-02-08 MED ORDER — ONDANSETRON HCL 4 MG PO TABS: 4.0000 mg | ORAL_TABLET | Freq: Three times a day (TID) | ORAL | Status: DC | PRN

## 2018-02-08 MED ORDER — MIRTAZAPINE 7.5 MG PO TABS
7.5000 mg | ORAL_TABLET | Freq: Every day | ORAL | Status: DC
  Administered 2018-02-08: 7.5 mg via ORAL
  Filled 2018-02-08 (×3): qty 1

## 2018-02-08 MED ORDER — LORAZEPAM 1 MG PO TABS
1.0000 mg | ORAL_TABLET | Freq: Four times a day (QID) | ORAL | Status: AC | PRN
  Administered 2018-02-08 – 2018-02-10 (×7): 1 mg via ORAL
  Filled 2018-02-08 (×7): qty 1

## 2018-02-08 MED ORDER — NICOTINE 21 MG/24HR TD PT24
21.0000 mg | MEDICATED_PATCH | Freq: Every day | TRANSDERMAL | Status: DC
  Administered 2018-02-08: 21 mg via TRANSDERMAL
  Filled 2018-02-08: qty 1

## 2018-02-08 MED ORDER — LORAZEPAM 2 MG/ML IJ SOLN: 0.0000 mg | Freq: Four times a day (QID) | INTRAMUSCULAR | Status: DC

## 2018-02-08 MED ORDER — IBUPROFEN 400 MG PO TABS: 600.0000 mg | ORAL_TABLET | Freq: Three times a day (TID) | ORAL | Status: DC | PRN

## 2018-02-08 MED ORDER — FLUOXETINE HCL 20 MG PO CAPS
20.0000 mg | ORAL_CAPSULE | Freq: Every day | ORAL | Status: DC
  Administered 2018-02-09: 20 mg via ORAL
  Filled 2018-02-08 (×3): qty 1

## 2018-02-08 MED ORDER — GABAPENTIN 300 MG PO CAPS
300.0000 mg | ORAL_CAPSULE | Freq: Three times a day (TID) | ORAL | Status: DC
  Administered 2018-02-08 – 2018-02-09 (×2): 300 mg via ORAL
  Filled 2018-02-08 (×6): qty 1

## 2018-02-08 MED ORDER — PNEUMOCOCCAL VAC POLYVALENT 25 MCG/0.5ML IJ INJ
0.5000 mL | INJECTION | INTRAMUSCULAR | Status: AC
  Administered 2018-02-10: 0.5 mL via INTRAMUSCULAR

## 2018-02-08 MED ORDER — NICOTINE 21 MG/24HR TD PT24
21.0000 mg | MEDICATED_PATCH | Freq: Every day | TRANSDERMAL | Status: DC
  Administered 2018-02-08 – 2018-02-12 (×5): 21 mg via TRANSDERMAL
  Filled 2018-02-08 (×8): qty 1

## 2018-02-08 MED ORDER — VITAMIN B-1 100 MG PO TABS
100.0000 mg | ORAL_TABLET | Freq: Every day | ORAL | Status: DC
  Administered 2018-02-08: 100 mg via ORAL
  Filled 2018-02-08: qty 1

## 2018-02-08 MED ORDER — LORAZEPAM 1 MG PO TABS: 0.0000 mg | ORAL_TABLET | Freq: Two times a day (BID) | ORAL | Status: DC

## 2018-02-08 MED ORDER — MIRTAZAPINE 15 MG PO TABS: 7.5000 mg | ORAL_TABLET | Freq: Every day | ORAL | Status: DC

## 2018-02-08 MED ORDER — HYDROXYZINE HCL 25 MG PO TABS
25.0000 mg | ORAL_TABLET | Freq: Three times a day (TID) | ORAL | Status: DC | PRN
  Filled 2018-02-08: qty 1

## 2018-02-08 MED ORDER — HYDROXYZINE HCL 25 MG PO TABS
25.0000 mg | ORAL_TABLET | Freq: Four times a day (QID) | ORAL | Status: AC | PRN
  Administered 2018-02-08 – 2018-02-11 (×4): 25 mg via ORAL
  Filled 2018-02-08 (×4): qty 1
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
## 2018-02-08 NOTE — ED Triage Notes (Signed)
Pt complains of SI. Pt states his last drink was this morning. Pt states he has been abusing clonipine and hydrocodone. Pt drinks vodka/beer daily. Pt drinks fifth of vodka daily. Pt states he has beenoff his psychiatric medication for a few days.

## 2018-02-08 NOTE — ED Notes (Signed)
Pt changed into maroon scrubs and wanded by security 

## 2018-02-08 NOTE — Tx Team (Signed)
Initial Treatment Plan 02/08/2018 7:45 PM Kenneth Elliott [id number]    PATIENT STRESSORS: Marital or family conflict Medication change or noncompliance Substance abuse   PATIENT STRENGTHS: Ability for insight Average or above average intelligence Capable of independent living Communication skills Motivation for treatment/growth Physical Health   PATIENT IDENTIFIED PROBLEMS:   "I want my personality back"      " this is going to kill me if I don't change"             DISCHARGE CRITERIA:  Ability to meet basic life and health needs Improved stabilization in mood, thinking, and/or behavior Motivation to continue treatment in a less acute level of care Need for constant or close observation no longer present Verbal commitment to aftercare and medication compliance  PRELIMINARY DISCHARGE PLAN: Attend aftercare/continuing care group Attend 12-step recovery group Outpatient therapy  PATIENT/FAMILY INVOLVEMENT: This treatment plan has been presented to and reviewed with the patient, Kenneth Elliott, The patient has been given the opportunity to ask questions and make suggestions.  Shela Nevin, RN 02/08/2018, 7:45 PM

## 2018-02-08 NOTE — ED Notes (Signed)
Pt's belongings placed in locker #6 

## 2018-02-08 NOTE — Progress Notes (Signed)
Per [facility], pt has been accepted to [facility] bed 305-1. Accepting provider is Reola Calkins, NP. Attending provider is Dr. Jola Babinski, MD. Patient can arrive by 4:00PM. Number for report is [phone]. CSW spoke with [name], RN regarding disposition.   Kenneth Elliott. Kenneth Elliott, MSW, LCSW Clinical Social Work/Disposition Phone: [phone] Fax: [phone]

## 2018-02-08 NOTE — ED Provider Notes (Signed)
[facility] EMERGENCY DEPARTMENT Provider Note   CSN: [id number] Arrival date & time: 02/08/18  1129     History   Chief Complaint Chief Complaint  Patient presents with  . Suicidal    HPI Kenneth Elliott is a 43 y.o. male.  The history is provided by the patient and medical records. No language interpreter was used.   Kenneth Elliott is a 43 y.o. male who presents to the Emergency Department complaining of SI. Presents to the emergency department voluntarily complaining of suicidal ideation. He has a history of bipolar disorder and prior suicidal thoughts as well as substance abuse. He has been off of his medications for the last week due to inability to afford them. He states that he is been abusing opiates, alcohol and fences heavily and had a blackout a few days ago. He woke up yesterday in his apartment and then began drinking alcohol again. He uses oral opiates, hydrocodone as well as oral benzodiazepines, preferably Klonopin. He states that he has ongoing suicidal ideation with plans to cut his wrist. He lives in an apartment in [place] by himself. He does have a girlfriend but they do not live together. He states that he is tired of living the way that he is. He wants help with his drug and alcohol use as well as his desire to harm himself. He has recently been admitted to behavioral health, and wishes to do this again. Past Medical History:  Diagnosis Date  . Bipolar 1 disorder (HCC)   . Depression   . Hypertension   . Lupus (HCC)   . Rheumatoid arthritis (HCC)   . Seizures (HCC)    per patient    Patient Active Problem List   Diagnosis Date Noted  . MDD (major depressive disorder), severe (HCC) 01/09/2018  . Adjustment disorder with mixed disturbance of emotions and conduct   . Substance induced mood disorder (HCC) 01/17/2016  . Severe episode of recurrent major depressive disorder, without psychotic features (HCC)   . Polysubstance dependence  (HCC) 10/05/2015    History reviewed. No pertinent surgical history.      Home Medications    Prior to Admission medications   Medication Sig Start Date End Date Taking? Authorizing Provider  FLUoxetine (PROZAC) 40 MG capsule Take 1 capsule (40 mg total) by mouth daily. 01/11/18   Oneta Rack, NP  gabapentin (NEURONTIN) 400 MG capsule Take 2 capsules (800 mg total) by mouth 3 (three) times daily. 11/28/17   Cherly Beach, DO  metoprolol succinate (TOPROL-XL) 100 MG 24 hr tablet Take 1 tablet (100 mg total) by mouth daily. Take with or immediately following a meal. 01/12/18   Oneta Rack, NP  mirtazapine (REMERON) 7.5 MG tablet Take 1 tablet (7.5 mg total) by mouth at bedtime. 01/11/18   Oneta Rack, NP  nicotine (NICODERM CQ - DOSED IN MG/24 HOURS) 21 mg/24hr patch Place 1 patch (21 mg total) onto the skin daily. 01/12/18   Oneta Rack, NP    Family History History reviewed. No pertinent family history.  Social History Social History   Tobacco Use  . Smoking status: Current Every Day Smoker    Packs/day: 1.50    Types: Cigarettes  . Smokeless tobacco: Never Used  Substance Use Topics  . Alcohol use: Yes    Alcohol/week: 6.0 - 8.0 standard drinks    Types: 6 - 8 Cans of beer per week    Comment: half gal liquor on  the weekends  . Drug use: Yes    Comment: opiates/benzos     Allergies   Patient has no known allergies.   Review of Systems Review of Systems  All other systems reviewed and are negative.    Physical Exam Updated Vital Signs BP (!) 141/115 (BP Location: Right Arm)   Pulse (!) 103   Temp 98.5 F (36.9 C) (Oral)   Resp 18   Ht 5\' 9"  (1.753 m)   Wt 83.9 kg   SpO2 97%   BMI 27.32 kg/m   Physical Exam Vitals signs and nursing note reviewed.  Constitutional:      Appearance: He is well-developed.  HENT:     Head: Normocephalic and atraumatic.  Cardiovascular:     Rate and Rhythm: Normal rate and regular rhythm.  Pulmonary:       Effort: Pulmonary effort is normal. No respiratory distress.  Musculoskeletal:        General: No tenderness.  Skin:    General: Skin is warm and dry.  Neurological:     Mental Status: He is alert and oriented to person, place, and time.  Psychiatric:        Behavior: Behavior normal.     Comments: Flat affect. Complains of SI      ED Treatments / Results  Labs (all labs ordered are listed, but only abnormal results are displayed) Labs Reviewed  COMPREHENSIVE METABOLIC PANEL - Abnormal; Notable for the following components:      Result Value   CO2 21 (*)    Glucose, Bld 105 (*)    AST 76 (*)    ALT 113 (*)    All other components within normal limits  ACETAMINOPHEN LEVEL - Abnormal; Notable for the following components:   Acetaminophen (Tylenol), Serum <10 (*)    All other components within normal limits  CBC - Abnormal; Notable for the following components:   WBC 11.6 (*)    All other components within normal limits  ETHANOL  SALICYLATE LEVEL  RAPID URINE DRUG SCREEN, HOSP PERFORMED    EKG None  Radiology No results found.  Procedures Procedures (including critical care time)  Medications Ordered in ED Medications - No data to display   Initial Impression / Assessment and Plan / ED Course  I have reviewed the triage vital signs and the nursing notes.  Pertinent labs & imaging results that were available during my care of the patient were reviewed by me and considered in my medical decision making (see chart for details).     Patient presents for evaluation of suicidal thoughts, polysubstance abuse. He is calm and appropriate on evaluation the emergency department. He has been medically cleared for psychiatric evaluation and treatment.  Final Clinical Impressions(s) / ED Diagnoses   Final diagnoses:  None    ED Discharge Orders    None       Tilden Fossa, MD 02/08/18 [phone]

## 2018-02-08 NOTE — Progress Notes (Signed)
Patient is a 43 year old male who presented voluntarily to [facility] for worsening depression and SI with a plan to cut his wrists. Pt had recently been discharged from [facility], but was unable to refill prescriptions due to lack of insurance. Pt recently relapsed on alcohol and has been drinking 1/5 vodka a day, and abusing benzos and hydrocodone. Pt reports blacking out this week while under the influence, and voices regret over his behavior with his gf, stating, "that wasn't me". Pt expresses a desire to go to a 30 day treatment facility. Pt presented with a sad affect/ mood and was calm and cooperative throughout the admission interview. Pt denies pain,SI/HI and A/V hallucinations. VS obtained. Consents signed. Belongings searched and secured in locker. Skin assessment revealed contusion approximately 6 inches right lower back. No other abnormalities nor contraband found. Pt oriented to unit. Q 15 min checks initiated for safety.

## 2018-02-09 DIAGNOSIS — F322 Major depressive disorder, single episode, severe without psychotic features: Principal | ICD-10-CM

## 2018-02-09 DIAGNOSIS — G4700 Insomnia, unspecified: Secondary | ICD-10-CM

## 2018-02-09 DIAGNOSIS — R45851 Suicidal ideations: Secondary | ICD-10-CM

## 2018-02-09 DIAGNOSIS — F1920 Other psychoactive substance dependence, uncomplicated: Secondary | ICD-10-CM

## 2018-02-09 LAB — TSH: TSH: 1.09 u[IU]/mL (ref 0.350–4.500)

## 2018-02-09 MED ORDER — GABAPENTIN 400 MG PO CAPS
800.0000 mg | ORAL_CAPSULE | Freq: Three times a day (TID) | ORAL | Status: DC
  Administered 2018-02-09 – 2018-02-12 (×11): 800 mg via ORAL
  Filled 2018-02-09: qty 2
  Filled 2018-02-09: qty 42
  Filled 2018-02-09 (×9): qty 2
  Filled 2018-02-09 (×2): qty 42
  Filled 2018-02-09 (×3): qty 2

## 2018-02-09 MED ORDER — METOPROLOL SUCCINATE ER 50 MG PO TB24
100.0000 mg | ORAL_TABLET | Freq: Every day | ORAL | Status: DC
  Administered 2018-02-09 – 2018-02-12 (×4): 100 mg via ORAL
  Filled 2018-02-09 (×4): qty 1
  Filled 2018-02-09: qty 14
  Filled 2018-02-09: qty 1

## 2018-02-09 MED ORDER — QUETIAPINE FUMARATE 100 MG PO TABS
100.0000 mg | ORAL_TABLET | Freq: Every day | ORAL | Status: DC
  Administered 2018-02-09: 100 mg via ORAL
  Filled 2018-02-09 (×3): qty 1

## 2018-02-09 MED ORDER — FLUOXETINE HCL 20 MG PO CAPS
40.0000 mg | ORAL_CAPSULE | Freq: Every day | ORAL | Status: DC
  Administered 2018-02-10 – 2018-02-12 (×3): 40 mg via ORAL
  Filled 2018-02-09: qty 2
  Filled 2018-02-09: qty 14
  Filled 2018-02-09 (×5): qty 2
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
## 2018-02-09 NOTE — Progress Notes (Signed)
Patient did attend the evening speaker AA meeting.  

## 2018-02-09 NOTE — Progress Notes (Signed)
D. Pt presents with an anxious affect/mood- friendly and cooperative behavior- observed interacting appropriately with peers in the dayroom. Per pt's self inventory, pt rates his depression, hopelessness and anxiety a 10/14/08, respectively. Pt writes that his most important goal today is "getting my personality back". Pt currently denies SI/HI and AVH and agrees to contact staff before acting on any harmful thoughts.  A. Labs and vitals monitored. Pt compliant with medications. Pt supported emotionally and encouraged to express concerns and ask questions.   R. Pt remains safe with 15 minute checks. Will continue POC.

## 2018-02-09 NOTE — [facility] Group Notes (Signed)
[name] LCSW Group Therapy Note  02/09/2018  10:00-11:00AM  Type of Therapy and Topic:  Group Therapy:  Adding Supports Including Being Your Own Support  Participation Level:  None   Description of Group:  Patients in this group were introduced to the concept that additional supports including self-support are an essential part of recovery.  A song entitled "Breaking Down" was played and a group discussion was held in reaction to the idea of needing to add supports.  A song entitled "My Own Hero" was played and a group discussion ensued in which patients stated they could relate to the song and it inspired them to realize they have be willing to help themselves in order to succeed, because other people cannot achieve sobriety or stability for them.  We discussed adding a variety of healthy supports to address the various needs in their lives.    Therapeutic Goals: 1)  demonstrate the importance of being a part of one's own support system 2)  discuss reasons people in one's life may eventually be unable to be continually supportive  3)  identify the patient's current support system and   4)  elicit commitments to add healthy supports and to become more conscious of being self-supportive   Summary of Patient Progress:  The patient dd not arrive until the last few minutes of group, as he was with a CSW.  Therapeutic Modalities:   Motivational Interviewing Activity  Lynnell Chad
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
## 2018-02-09 NOTE — Progress Notes (Signed)
Writer has observed ;patient up in the dayroom watching the football came with peers. He attended group tonight and later came to nursing station so writer could speak with him 1:1 about his medications. He reporrts that he is having withdrawal pretty bad and requested  Other prns to help. Support givnen and safety maintained on unit with 15 min checks.

## 2018-02-09 NOTE — [facility] Group Notes (Signed)
[name] Group Notes:  (Nursing)  Date:  02/09/2018  Time: 1:15 Pm Type of Therapy:  Nurse Education  Participation Level:  Active  Participation Quality:  Appropriate  Affect:  Appropriate  Cognitive:  Appropriate  Insight:  Appropriate  Engagement in Group:  Engaged  Modes of Intervention:  Education  Summary of Progress/Problems: Healthy Supports  Shela Nevin 02/09/2018, 5:59 PM

## 2018-02-09 NOTE — Progress Notes (Signed)
Writer spoke with patient 1:1 and he reports that his day has been fine, he attended groups and had no problems other than not sleeping well last night. He is glad his medication was changed for sleep and is hopeful he will rest tonight. Support given and safety maintained on unit with 15 min checks.

## 2018-02-09 NOTE — [facility] Group Notes (Deleted)
[name] Group Notes:  (Nursing/)  Date:  02/09/2018  Time: 1:30 PM Type of Therapy:  Nurse Education  Participation Level:  Did Not Attend   Shela Nevin 02/09/2018, 5:52 PM

## 2018-02-10 MED ORDER — CARBAMAZEPINE 100 MG PO CHEW
100.0000 mg | CHEWABLE_TABLET | Freq: Three times a day (TID) | ORAL | Status: DC
  Administered 2018-02-10 – 2018-02-12 (×8): 100 mg via ORAL
  Filled 2018-02-10 (×3): qty 1
  Filled 2018-02-10: qty 21
  Filled 2018-02-10: qty 1
  Filled 2018-02-10: qty 21
  Filled 2018-02-10: qty 1
  Filled 2018-02-10: qty 21
  Filled 2018-02-10 (×5): qty 1

## 2018-02-10 MED ORDER — QUETIAPINE FUMARATE 200 MG PO TABS
200.0000 mg | ORAL_TABLET | Freq: Every day | ORAL | Status: DC
  Administered 2018-02-10 – 2018-02-11 (×2): 200 mg via ORAL
  Filled 2018-02-10 (×3): qty 1
  Filled 2018-02-10: qty 7

## 2018-02-10 NOTE — Progress Notes (Signed)
Nursing Note: 0700-1900  D:  Pt presents with depressed mood but noted to brighten throughout shift. States that he slept poor last night, concentrations is poor and appetite is fair.  Rates anxiety as 9/10 due to withdrawal, though noted improvement throughout shift.  Goal is to work on feelings today.  A:  Encouraged to verbalize needs and concerns, active listening and support provided.  Continued Q 15 minute safety checks.  Both Flu and Pneumonia vaccine administered as ordered and Tegretol as well.  Indications and potential side effects discussed prior to administration. Observed active participation in group settings.  R:  Pt. Is pleasant and cooperative.  Denies A/V hallucinations and is able to verbally contract for safety.

## 2018-02-10 NOTE — Progress Notes (Signed)
[facility] MD Progress Note  02/10/2018 11:29 AM Kenneth Elliott  MRN:  [id number] Subjective:    Patient reports no acute withdrawal symptoms believe this is that his case is being managed well in that regard states that he is upset at his relapse though again alcohol level was negligible on presentation.  He is in the day room he is alert and oriented and cooperative without thoughts of harming self or others states he is really doing okay as far as any withdrawal symptoms.  Believes alcohol to be his main problem but also there is concern of clonazepam for years this would put him at risk for seizures which we discussed.  Again drug screen negative so it is unclear if he is using this as a means of drug-seeking but we will put him on Tegretol and Neurontin to be on the safe side Principal Problem: MDD (major depressive disorder), severe (HCC) Diagnosis: Principal Problem:   MDD (major depressive disorder), severe (HCC) Active Problems:   Polysubstance dependence (HCC)  Total Time spent with patient: 20 minutes   Past Medical History:  Past Medical History:  Diagnosis Date  . Bipolar 1 disorder (HCC)   . Depression   . Hypertension   . Lupus (HCC)   . Rheumatoid arthritis (HCC)   . Seizures (HCC)    per patient   History reviewed. No pertinent surgical history. Family History: History reviewed. No pertinent family history.  Social History:  Social History   Substance and Sexual Activity  Alcohol Use Yes  . Alcohol/week: 6.0 - 8.0 standard drinks  . Types: 6 - 8 Cans of beer per week   Comment: half gal liquor on the weekends     Social History   Substance and Sexual Activity  Drug Use Yes   Comment: opiates/benzos    Social History   Socioeconomic History  . Marital status: Divorced    Spouse name: Not on file  . Number of children: Not on file  . Years of education: Not on file  . Highest education level: Not on file  Occupational History  . Not on file  Social Needs   . Financial resource strain: Not on file  . Food insecurity:    Worry: Not on file    Inability: Not on file  . Transportation needs:    Medical: Not on file    Non-medical: Not on file  Tobacco Use  . Smoking status: Current Every Day Smoker    Packs/day: 1.50    Types: Cigarettes  . Smokeless tobacco: Never Used  Substance and Sexual Activity  . Alcohol use: Yes    Alcohol/week: 6.0 - 8.0 standard drinks    Types: 6 - 8 Cans of beer per week    Comment: half gal liquor on the weekends  . Drug use: Yes    Comment: opiates/benzos  . Sexual activity: Yes    Partners: Female    Birth control/protection: None  Lifestyle  . Physical activity:    Days per week: Not on file    Minutes per session: Not on file  . Stress: Not on file  Relationships  . Social connections:    Talks on phone: Not on file    Gets together: Not on file    Attends religious service: Not on file    Active member of club or organization: Not on file    Attends meetings of clubs or organizations: Not on file    Relationship status: Not on file  Other Topics Concern  . Not on file  Social History Narrative  . Not on file   Additional Social History:                         Sleep: Fair  Appetite:  Fair  Current Medications: Current Facility-Administered Medications  Medication Dose Route Frequency Provider Last Rate Last Dose  . acetaminophen (TYLENOL) tablet 650 mg  650 mg Oral Q6H PRN Money, Gerlene Burdockravis B, FNP   650 mg at 02/09/18 0747  . alum & mag hydroxide-simeth (MAALOX/MYLANTA) 200-200-20 MG/5ML suspension 30 mL  30 mL Oral Q4H PRN Money, Gerlene Burdockravis B, FNP      . FLUoxetine (PROZAC) capsule 40 mg  40 mg Oral Daily Antonieta Pertlary, Greg Lawson, MD   40 mg at 02/10/18 0740  . gabapentin (NEURONTIN) capsule 800 mg  800 mg Oral TID Antonieta Pertlary, Greg Lawson, MD   800 mg at 02/10/18 0739  . hydrOXYzine (ATARAX/VISTARIL) tablet 25 mg  25 mg Oral Q6H PRN Money, Gerlene Burdockravis B, FNP   25 mg at 02/10/18 0741  . [START  ON 02/12/2018] hydrOXYzine (ATARAX/VISTARIL) tablet 25 mg  25 mg Oral TID PRN Money, Gerlene Burdockravis B, FNP      . Influenza vac split quadrivalent PF (FLUARIX) injection 0.5 mL  0.5 mL Intramuscular Tomorrow-1000 Antonieta Pertlary, Greg Lawson, MD      . loperamide (IMODIUM) capsule 2-4 mg  2-4 mg Oral PRN Money, Gerlene Burdockravis B, FNP   4 mg at 02/09/18 1457  . LORazepam (ATIVAN) tablet 1 mg  1 mg Oral Q6H PRN Money, Gerlene Burdockravis B, FNP   1 mg at 02/10/18 0608  . magnesium hydroxide (MILK OF MAGNESIA) suspension 30 mL  30 mL Oral Daily PRN Money, Gerlene Burdockravis B, FNP      . metoprolol succinate (TOPROL-XL) 24 hr tablet 100 mg  100 mg Oral Daily Antonieta Pertlary, Greg Lawson, MD   100 mg at 02/10/18 0739  . multivitamin with minerals tablet 1 tablet  1 tablet Oral Daily Money, Gerlene Burdockravis B, FNP   1 tablet at 02/10/18 [phone]  . nicotine (NICODERM CQ - dosed in mg/24 hours) patch 21 mg  21 mg Transdermal Daily Oneta RackLewis, Tanika N, NP   21 mg at 02/10/18 0742  . ondansetron (ZOFRAN-ODT) disintegrating tablet 4 mg  4 mg Oral Q6H PRN Money, Gerlene Burdockravis B, FNP   4 mg at 02/10/18 0608  . pneumococcal 23 valent vaccine (PNU-IMMUNE) injection 0.5 mL  0.5 mL Intramuscular Tomorrow-1000 Antonieta Pertlary, Greg Lawson, MD      . QUEtiapine (SEROQUEL) tablet 100 mg  100 mg Oral QHS Antonieta Pertlary, Greg Lawson, MD   100 mg at 02/09/18 2159  . thiamine (B-1) injection 100 mg  100 mg Intramuscular Once Money, Feliz Beamravis B, FNP      . thiamine (VITAMIN B-1) tablet 100 mg  100 mg Oral Daily Money, Gerlene Burdockravis B, FNP   100 mg at 02/10/18 0739  . traZODone (DESYREL) tablet 50 mg  50 mg Oral QHS PRN Money, Gerlene Burdockravis B, FNP   50 mg at 02/08/18 2118    Lab Results:  Results for orders placed or performed during the hospital encounter of 02/08/18 (from the past 48 hour(s))  TSH     Status: None   Collection Time: 02/09/18  6:45 AM  Result Value Ref Range   TSH 1.090 0.350 - 4.500 uIU/mL    Comment: Performed by a 3rd Generation assay with a functional sensitivity of <=0.01 uIU/mL. Performed at [facility], [address] [place], [place] [address]     Blood Alcohol level:  Lab Results  Component Value Date   ETH <10 02/08/2018   ETH 97 (H) 01/08/2018    Metabolic Disorder Labs: No results found for: HGBA1C, MPG No results found for: PROLACTIN No results found for: CHOL, TRIG, HDL, CHOLHDL, VLDL, LDLCALC  Physical Findings: AIMS:  , ,  ,  ,    CIWA:  CIWA-Ar Total: 1 COWS:     Musculoskeletal: Strength & Muscle Tone: within normal limits Gait & Station: normal Patient leans: N/A  Psychiatric Specialty Exam: Physical Exam  ROS  Blood pressure 114/77, pulse 68, temperature 98.4 F (36.9 C), temperature source Oral, resp. rate 18, height 5\' 9"  (1.753 m), weight 84.4 kg, SpO2 100 %.Body mass index is 27.47 kg/m.  General Appearance: Casual  Eye Contact:  Good  Speech:  Clear and Coherent  Volume:  Normal  Mood:  Euthymic  Affect:  Constricted  Thought Process:  Coherent  Orientation:  Full (Time, Place, and Person)  Thought Content:  Tangential  Suicidal Thoughts:  No  Homicidal Thoughts:  No  Memory:  Immediate;   Fair  Judgement:  Intact  Insight:  Fair  Psychomotor Activity:  Normal  Concentration:  Concentration: Good  Recall:  Good  Fund of Knowledge:  Good  Language:  Good  Akathisia:  Negative  Handed:  Right  AIMS (if indicated):     Assets:  Desire for Improvement  ADL's:  Intact  Cognition:  WNL  Sleep:  Number of Hours: 4     Treatment Plan Summary: Daily contact with patient to assess and evaluate symptoms and progress in treatment, Medication management and Plan Continue current PRN medications continue antidepressant therapy for concerns about long-term Klonopin abuse versus dependence, which is possible we will begin carbamazepine and Neurontin  Ceonna Frazzini, MD 02/10/2018, 11:29 AM

## 2018-02-10 NOTE — Progress Notes (Signed)
Recreation Therapy Notes  Date: 1.6.20 Time: 0930 Location: [address] Dayroom  Group Topic: Stress Management  Goal Area(s) Addresses:  Patient will engage in healthy stress management techniques. Patient will be able to identify positive stress management techniques.  Behavioral Response: Engaged  Intervention: Stress Management  Activity : Meditation.  LRT introduced the stress management technique of meditation.  LRT played a meditation that focused on looking at each day as a new beginning.  Patients were to follow along as meditation was played in order to engage in activity.  Education:  Stress Management, Discharge Planning.   Education Outcome: Acknowledges Education  Clinical Observations/Feedback: Pt attended and participated in activity.    Caroll Rancher, LRT/CTRS         Caroll Rancher A 02/10/2018 10:46 AM

## 2018-02-10 NOTE — Progress Notes (Signed)
Patient attended AA group meeting.  

## 2018-02-10 NOTE — Tx Team (Signed)
Interdisciplinary Treatment and Diagnostic Plan Update  02/10/2018 Time of Session: 0830AM Kenneth Elliott MRN: [id number]  Principal Diagnosis: MDD (major depressive disorder), severe (HCC)  Secondary Diagnoses: Principal Problem:   MDD (major depressive disorder), severe (HCC) Active Problems:   Polysubstance dependence (HCC)   Current Medications:  Current Facility-Administered Medications  Medication Dose Route Frequency Provider Last Rate Last Dose  . acetaminophen (TYLENOL) tablet 650 mg  650 mg Oral Q6H PRN Money, Gerlene Burdock, FNP   650 mg at 02/09/18 0747  . alum & mag hydroxide-simeth (MAALOX/MYLANTA) 200-200-20 MG/5ML suspension 30 mL  30 mL Oral Q4H PRN Money, Gerlene Burdock, FNP      . FLUoxetine (PROZAC) capsule 40 mg  40 mg Oral Daily Antonieta Pert, MD   40 mg at 02/10/18 0740  . gabapentin (NEURONTIN) capsule 800 mg  800 mg Oral TID Antonieta Pert, MD   800 mg at 02/10/18 0739  . hydrOXYzine (ATARAX/VISTARIL) tablet 25 mg  25 mg Oral Q6H PRN Money, Gerlene Burdock, FNP   25 mg at 02/10/18 0741  . [START ON 02/12/2018] hydrOXYzine (ATARAX/VISTARIL) tablet 25 mg  25 mg Oral TID PRN Money, Gerlene Burdock, FNP      . Influenza vac split quadrivalent PF (FLUARIX) injection 0.5 mL  0.5 mL Intramuscular Tomorrow-1000 Antonieta Pert, MD      . loperamide (IMODIUM) capsule 2-4 mg  2-4 mg Oral PRN Money, Gerlene Burdock, FNP   4 mg at 02/09/18 1457  . LORazepam (ATIVAN) tablet 1 mg  1 mg Oral Q6H PRN Money, Gerlene Burdock, FNP   1 mg at 02/10/18 0608  . magnesium hydroxide (MILK OF MAGNESIA) suspension 30 mL  30 mL Oral Daily PRN Money, Gerlene Burdock, FNP      . metoprolol succinate (TOPROL-XL) 24 hr tablet 100 mg  100 mg Oral Daily Antonieta Pert, MD   100 mg at 02/10/18 0739  . multivitamin with minerals tablet 1 tablet  1 tablet Oral Daily Money, Gerlene Burdock, FNP   1 tablet at 02/10/18 [phone]  . nicotine (NICODERM CQ - dosed in mg/24 hours) patch 21 mg  21 mg Transdermal Daily Oneta Rack, NP   21 mg at  02/10/18 0742  . ondansetron (ZOFRAN-ODT) disintegrating tablet 4 mg  4 mg Oral Q6H PRN Money, Gerlene Burdock, FNP   4 mg at 02/10/18 0608  . pneumococcal 23 valent vaccine (PNU-IMMUNE) injection 0.5 mL  0.5 mL Intramuscular Tomorrow-1000 Antonieta Pert, MD      . QUEtiapine (SEROQUEL) tablet 100 mg  100 mg Oral QHS Antonieta Pert, MD   100 mg at 02/09/18 2159  . thiamine (B-1) injection 100 mg  100 mg Intramuscular Once Money, Feliz Beam B, FNP      . thiamine (VITAMIN B-1) tablet 100 mg  100 mg Oral Daily Money, Gerlene Burdock, FNP   100 mg at 02/10/18 0739  . traZODone (DESYREL) tablet 50 mg  50 mg Oral QHS PRN Money, Gerlene Burdock, FNP   50 mg at 02/08/18 2118   PTA Medications: Medications Prior to Admission  Medication Sig Dispense Refill Last Dose  . FLUoxetine (PROZAC) 40 MG capsule Take 1 capsule (40 mg total) by mouth daily. 30 capsule 0   . gabapentin (NEURONTIN) 400 MG capsule Take 2 capsules (800 mg total) by mouth 3 (three) times daily. 60 capsule 0 01/08/2018 at Unknown time  . metoprolol succinate (TOPROL-XL) 100 MG 24 hr tablet Take 1 tablet (100 mg total) by  mouth daily. Take with or immediately following a meal. 30 tablet 0   . mirtazapine (REMERON) 7.5 MG tablet Take 1 tablet (7.5 mg total) by mouth at bedtime. 30 tablet 0   . nicotine (NICODERM CQ - DOSED IN MG/24 HOURS) 21 mg/24hr patch Place 1 patch (21 mg total) onto the skin daily. 28 patch 0     Patient Stressors: Marital or family conflict Medication change or noncompliance Substance abuse  Patient Strengths: Ability for insight Average or above average intelligence Capable of independent living Communication skills Motivation for treatment/growth Physical Health  Treatment Modalities: Medication Management, Group therapy, Case management,  1 to 1 session with clinician, Psychoeducation, Recreational therapy.   Physician Treatment Plan for Primary Diagnosis: MDD (major depressive disorder), severe (HCC) Long Term  Goal(s): Improvement in symptoms so as ready for discharge Improvement in symptoms so as ready for discharge   Short Term Goals: Ability to identify changes in lifestyle to reduce recurrence of condition will improve Ability to verbalize feelings will improve Ability to disclose and discuss suicidal ideas Ability to demonstrate self-control will improve Ability to identify and develop effective coping behaviors will improve Ability to maintain clinical measurements within normal limits will improve Ability to identify triggers associated with substance abuse/mental health issues will improve Ability to identify changes in lifestyle to reduce recurrence of condition will improve Ability to disclose and discuss suicidal ideas Ability to demonstrate self-control will improve Ability to maintain clinical measurements within normal limits will improve Ability to identify triggers associated with substance abuse/mental health issues will improve  Medication Management: Evaluate patient's response, side effects, and tolerance of medication regimen.  Therapeutic Interventions: 1 to 1 sessions, Unit Group sessions and Medication administration.  Evaluation of Outcomes: Progressing  Physician Treatment Plan for Secondary Diagnosis: Principal Problem:   MDD (major depressive disorder), severe (HCC) Active Problems:   Polysubstance dependence (HCC)  Long Term Goal(s): Improvement in symptoms so as ready for discharge Improvement in symptoms so as ready for discharge   Short Term Goals: Ability to identify changes in lifestyle to reduce recurrence of condition will improve Ability to verbalize feelings will improve Ability to disclose and discuss suicidal ideas Ability to demonstrate self-control will improve Ability to identify and develop effective coping behaviors will improve Ability to maintain clinical measurements within normal limits will improve Ability to identify triggers associated  with substance abuse/mental health issues will improve Ability to identify changes in lifestyle to reduce recurrence of condition will improve Ability to disclose and discuss suicidal ideas Ability to demonstrate self-control will improve Ability to maintain clinical measurements within normal limits will improve Ability to identify triggers associated with substance abuse/mental health issues will improve     Medication Management: Evaluate patient's response, side effects, and tolerance of medication regimen.  Therapeutic Interventions: 1 to 1 sessions, Unit Group sessions and Medication administration.  Evaluation of Outcomes: Progressing   RN Treatment Plan for Primary Diagnosis: MDD (major depressive disorder), severe (HCC) Long Term Goal(s): Knowledge of disease and therapeutic regimen to maintain health will improve  Short Term Goals: Ability to remain free from injury will improve, Ability to participate in decision making will improve, Ability to disclose and discuss suicidal ideas and Ability to identify and develop effective coping behaviors will improve  Medication Management: RN will administer medications as ordered by provider, will assess and evaluate patient's response and provide education to patient for prescribed medication. RN will report any adverse and/or side effects to prescribing provider.  Therapeutic Interventions:  1 on 1 counseling sessions, Psychoeducation, Medication administration, Evaluate responses to treatment, Monitor vital signs and CBGs as ordered, Perform/monitor CIWA, COWS, AIMS and Fall Risk screenings as ordered, Perform wound care treatments as ordered.  Evaluation of Outcomes: Progressing   LCSW Treatment Plan for Primary Diagnosis: MDD (major depressive disorder), severe (HCC) Long Term Goal(s): Safe transition to appropriate next level of care at discharge, Engage patient in therapeutic group addressing interpersonal concerns.  Short Term  Goals: Engage patient in aftercare planning with referrals and resources, Facilitate patient progression through stages of change regarding substance use diagnoses and concerns and Identify triggers associated with mental health/substance abuse issues  Therapeutic Interventions: Assess for all discharge needs, 1 to 1 time with Social worker, Explore available resources and support systems, Assess for adequacy in community support network, Educate family and significant other(s) on suicide prevention, Complete Psychosocial Assessment, Interpersonal group therapy.  Evaluation of Outcomes: Progressing   Progress in Treatment: Attending groups: Yes. Participating in groups: Yes. Taking medication as prescribed: Yes. Toleration medication: Yes. Family/Significant other contact made: SPE completed with pt; pt declined to consent to collateral contact.  Patient understands diagnosis: Yes. Discussing patient identified problems/goals with staff: Yes. Medical problems stabilized or resolved: Yes. Denies suicidal/homicidal ideation: Yes Issues/concerns per patient self-inventory: No. Other: n/a  New problem(s) identified: No, Describe:  n/a  New Short Term/Long Term Goal(s): detox, medication management for mood stabilization; elimination of SI thoughts; development of comprehensive mental wellness/sobriety plan.   Patient Goals:  "I want to get help and stick with a plan."   Discharge Plan or Barriers: CSW assessing for appropriate referrals. [facility] pamphlet, Mobile Crisis information, and AA/NA information provided to patient for additional community support and resources.   Reason for Continuation of Hospitalization: Anxiety Depression Medication stabilization Suicidal ideation Withdrawal symptoms  Estimated Length of Stay: Wed, 02/12/18  Attendees: Patient: Kenneth Elliott  02/10/2018 9:42 AM  Physician: Dr. Jeannine KittenFarah MD 02/10/2018 9:42 AM  Nursing: Casimiro NeedleMichael RN; Velna HatchetSheila RN 02/10/2018 9:42 AM  RN  Care Manager:x 02/10/2018 9:42 AM  Social Worker: Corrie MckusickHeather Marieliz Strang LCSW 02/10/2018 9:42 AM  Recreational Therapist:  02/10/2018 9:42 AM  Other: Armandina StammerAgnes Nwoko NP; Marciano SequinJanet Sykes NP 02/10/2018 9:42 AM  Other:  02/10/2018 9:42 AM  Other: 02/10/2018 9:42 AM    Scribe for Treatment Team: Rona RavensHeather S Tilmon Wisehart, LCSW 02/10/2018 9:42 AM

## 2018-02-10 NOTE — Progress Notes (Signed)
CSW spoke with June in admissions at [facility]. Pt has screening for possible admission on Tuesday, 1/21 but may also walk in either Wed 1/8 or Thursday 1/9 for possible screening and admission as well.   Schylar Allard S. Alan Ripper, MSW, LCSW Clinical Social Worker 02/10/2018 11:30 AM

## 2018-02-11 MED ORDER — HYDROXYZINE HCL 25 MG PO TABS
25.0000 mg | ORAL_TABLET | Freq: Three times a day (TID) | ORAL | Status: DC | PRN
  Administered 2018-02-11 – 2018-02-12 (×2): 25 mg via ORAL
  Filled 2018-02-11: qty 1
  Filled 2018-02-11 (×2): qty 10

## 2018-02-11 NOTE — Progress Notes (Signed)
Pt observed in the dayroom, seen interacting with peers. Pt appears animated/anxious in affect and mood. Pt denies SI/HI/AVH/Pain at this time. Pt remains preoccupied with various somatic c/o's.Pt c/o of heartburn and increasing anxiety.Pt states he might get to be d/c tomorrow. Support offered. Will continue with POC.

## 2018-02-11 NOTE — Progress Notes (Signed)
D: Patient denies SI, HI or AVH. Patient presents and anxious and depressed but brightens on approach and is otherwise pleasant and cooperative.  Pt. Reported that he had difficulty with sleep the night before last and despite increasing his Seroquel and other PRN medication patient continued to have some difficulty.  Pt. Is visualized in the dayroom interacting with staff and his peers.   A: Patient given emotional support from RN. Patient encouraged to come to staff with concerns and/or questions. Patient's medication routine continued. Patient's orders and plan of care reviewed.   R: Patient remains appropriate and cooperative. Will continue to monitor patient q15 minutes for safety.

## 2018-02-11 NOTE — Progress Notes (Signed)
[facility], [place] MD Progress Note  02/11/2018 11:19 AM Kenneth Elliott  MRN:  [id number] Subjective:    Patient generally participating he is in bed but alert oriented cooperative without thoughts of self-harm has made no arrangements for rehab although is aware that there is a walk-in option at a local facility by Wednesday.  He is alert and oriented and cooperative without thoughts of self-harm no cravings tremors withdrawal no psychosis Principal Problem: MDD (major depressive disorder), severe (HCC) Diagnosis: Principal Problem:   MDD (major depressive disorder), severe (HCC) Active Problems:   Polysubstance dependence (HCC)  Total Time spent with patient: 15 minutes  Past Medical History:  Past Medical History:  Diagnosis Date  . Bipolar 1 disorder (HCC)   . Depression   . Hypertension   . Lupus (HCC)   . Rheumatoid arthritis (HCC)   . Seizures (HCC)    per patient   History reviewed. No pertinent surgical history. Family History: History reviewed. No pertinent family history. Family Psychiatric  History: neg Social History:  Social History   Substance and Sexual Activity  Alcohol Use Yes  . Alcohol/week: 6.0 - 8.0 standard drinks  . Types: 6 - 8 Cans of beer per week   Comment: half gal liquor on the weekends     Social History   Substance and Sexual Activity  Drug Use Yes   Comment: opiates/benzos    Social History   Socioeconomic History  . Marital status: Divorced    Spouse name: Not on file  . Number of children: Not on file  . Years of education: Not on file  . Highest education level: Not on file  Occupational History  . Not on file  Social Needs  . Financial resource strain: Not on file  . Food insecurity:    Worry: Not on file    Inability: Not on file  . Transportation needs:    Medical: Not on file    Non-medical: Not on file  Tobacco Use  . Smoking status: Current Every Day Smoker    Packs/day: 1.50    Types: Cigarettes  . Smokeless tobacco: Never  Used  Substance and Sexual Activity  . Alcohol use: Yes    Alcohol/week: 6.0 - 8.0 standard drinks    Types: 6 - 8 Cans of beer per week    Comment: half gal liquor on the weekends  . Drug use: Yes    Comment: opiates/benzos  . Sexual activity: Yes    Partners: Female    Birth control/protection: None  Lifestyle  . Physical activity:    Days per week: Not on file    Minutes per session: Not on file  . Stress: Not on file  Relationships  . Social connections:    Talks on phone: Not on file    Gets together: Not on file    Attends religious service: Not on file    Active member of club or organization: Not on file    Attends meetings of clubs or organizations: Not on file    Relationship status: Not on file  Other Topics Concern  . Not on file  Social History Narrative  . Not on file   Additional Social History:                         Sleep: Good  Appetite:  Good  Current Medications: Current Facility-Administered Medications  Medication Dose Route Frequency Provider Last Rate Last Dose  . acetaminophen (TYLENOL)  tablet 650 mg  650 mg Oral Q6H PRN Money, Gerlene Burdockravis B, FNP   650 mg at 02/11/18 0247  . alum & mag hydroxide-simeth (MAALOX/MYLANTA) 200-200-20 MG/5ML suspension 30 mL  30 mL Oral Q4H PRN Money, Feliz Beamravis B, FNP   30 mL at 02/11/18 1113  . carbamazepine (TEGRETOL) chewable tablet 100 mg  100 mg Oral TID Malvin JohnsFarah, Norvel Wenker, MD   100 mg at 02/11/18 1113  . FLUoxetine (PROZAC) capsule 40 mg  40 mg Oral Daily Antonieta Pertlary, Greg Lawson, MD   40 mg at 02/11/18 0758  . gabapentin (NEURONTIN) capsule 800 mg  800 mg Oral TID Antonieta Pertlary, Greg Lawson, MD   800 mg at 02/11/18 1113  . hydrOXYzine (ATARAX/VISTARIL) tablet 25 mg  25 mg Oral Q6H PRN Money, Gerlene Burdockravis B, FNP   25 mg at 02/11/18 0247  . [START ON 02/12/2018] hydrOXYzine (ATARAX/VISTARIL) tablet 25 mg  25 mg Oral TID PRN Money, Gerlene Burdockravis B, FNP      . loperamide (IMODIUM) capsule 2-4 mg  2-4 mg Oral PRN Money, Gerlene Burdockravis B, FNP   4 mg at  02/09/18 1457  . LORazepam (ATIVAN) tablet 1 mg  1 mg Oral Q6H PRN Money, Gerlene Burdockravis B, FNP   1 mg at 02/10/18 2128  . magnesium hydroxide (MILK OF MAGNESIA) suspension 30 mL  30 mL Oral Daily PRN Money, Gerlene Burdockravis B, FNP      . metoprolol succinate (TOPROL-XL) 24 hr tablet 100 mg  100 mg Oral Daily Antonieta Pertlary, Greg Lawson, MD   100 mg at 02/11/18 0757  . multivitamin with minerals tablet 1 tablet  1 tablet Oral Daily Money, Gerlene Burdockravis B, FNP   1 tablet at 02/11/18 0758  . nicotine (NICODERM CQ - dosed in mg/24 hours) patch 21 mg  21 mg Transdermal Daily Oneta RackLewis, Tanika N, NP   21 mg at 02/11/18 0803  . ondansetron (ZOFRAN-ODT) disintegrating tablet 4 mg  4 mg Oral Q6H PRN Money, Gerlene Burdockravis B, FNP   4 mg at 02/10/18 2131  . QUEtiapine (SEROQUEL) tablet 200 mg  200 mg Oral QHS Malvin JohnsFarah, Yazid Pop, MD   200 mg at 02/10/18 2127  . thiamine (B-1) injection 100 mg  100 mg Intramuscular Once Money, Feliz Beamravis B, FNP      . thiamine (VITAMIN B-1) tablet 100 mg  100 mg Oral Daily Money, Gerlene Burdockravis B, FNP   100 mg at 02/11/18 0757  . traZODone (DESYREL) tablet 50 mg  50 mg Oral QHS PRN Money, Gerlene Burdockravis B, FNP   50 mg at 02/08/18 2118    Lab Results: No results found for this or any previous visit (from the past 48 hour(s)).  Blood Alcohol level:  Lab Results  Component Value Date   ETH <10 02/08/2018   ETH 97 (H) 01/08/2018    Metabolic Disorder Labs: No results found for: HGBA1C, MPG No results found for: PROLACTIN No results found for: CHOL, TRIG, HDL, CHOLHDL, VLDL, LDLCALC  Physical Findings: AIMS:  , ,  ,  ,    CIWA:  CIWA-Ar Total: 9 COWS:     Musculoskeletal: Strength & Muscle Tone: within normal limits Gait & Station: normal Patient leans: N/A  Psychiatric Specialty Exam: Physical Exam  ROS  Blood pressure 120/83, pulse 86, temperature 98.1 F (36.7 C), temperature source Oral, resp. rate 18, height 5\' 9"  (1.753 m), weight 84.4 kg, SpO2 100 %.Body mass index is 27.47 kg/m.  General Appearance: Casual  Eye Contact:   Good  Speech:  Clear and Coherent  Volume:  Normal  Mood:  Euthymic  Affect:  Appropriate  Thought Process:  Coherent  Orientation:  Full (Time, Place, and Person)  Thought Content:  Tangential  Suicidal Thoughts:  No  Homicidal Thoughts:  No  Memory:  Immediate;   Good  Judgement:  Good  Insight:  Good  Psychomotor Activity:  Normal  Concentration:  Concentration: Good  Recall:  Good  Fund of Knowledge:  Good  Language:  Good  Akathisia:  Negative  Handed:  Right  AIMS (if indicated):     Assets:  Communication Skills Desire for Improvement  ADL's:  Intact  Cognition:  WNL  Sleep:  Number of Hours: 5     Treatment Plan Summary: Daily contact with patient to assess and evaluate symptoms and progress in treatment, Medication management and Plan Continue current PRN medications continue current groups may discharge tomorrow  Malvin Johns, MD 02/11/2018, 11:19 AM

## 2018-02-11 NOTE — Progress Notes (Signed)
CSW met with pt to discuss aftercare. Pt referred to [name] but currently, on waitlist, as there are not beds. Pt made aware of [facility]. He states that he will have a safe place to discharge on Wed, 1/8 and a friend can pick him up at 6pm that day. Per MD, pt may discharge on Wed. [facility] appt made for outpatient mental health follow-up.   Yesica Kemler S. Ouida Sills, MSW, LCSW Clinical Social Worker 02/11/2018 10:46 AM

## 2018-02-11 NOTE — Progress Notes (Signed)
Patient did not attend wrap up group. 

## 2018-02-11 NOTE — Progress Notes (Signed)
D: Patient up at med window this morning requesting ativan.  He states, "the nurse on nights said I could have it."  Patient was informed that he is on a CIWA protocol and he does not meet the parameters to receive it.  His CIWA scores have been 0 to 2.  He exhibits some med seeking behavior, however, he accepted the nurse's explanation.  He denies any thoughts of self harm today.  He is visible in the milieu.  A: Continue to monitor medication management and MD orders.  Safety checks completed every 15 minutes per protocol.  Offer support and encouragement as needed.  R: Patient is receptive to staff; his behavior is appropriate.

## 2018-02-11 NOTE — [facility] Group Notes (Signed)
[facility] Group Therapy 02/11/2018 1:15pm  Type of Therapy: Mental [facility] Association Presentation  Participation Level: Pt invited. DID NOT ATTEND. Chose to remain in bed.   Rona Ravens, LCSW 02/11/2018 4:01 PM

## 2018-02-12 MED ORDER — QUETIAPINE FUMARATE 200 MG PO TABS: 200.0000 mg | ORAL_TABLET | Freq: Every day | ORAL | 0 refills | Status: AC

## 2018-02-12 MED ORDER — HYDROXYZINE HCL 25 MG PO TABS: 25.0000 mg | ORAL_TABLET | Freq: Three times a day (TID) | ORAL | 0 refills | Status: DC | PRN

## 2018-02-12 MED ORDER — METOPROLOL SUCCINATE ER 100 MG PO TB24: 100.0000 mg | ORAL_TABLET | Freq: Every day | ORAL | 0 refills | Status: DC

## 2018-02-12 MED ORDER — GABAPENTIN 400 MG PO CAPS: 800.0000 mg | ORAL_CAPSULE | Freq: Three times a day (TID) | ORAL | 0 refills | Status: AC

## 2018-02-12 MED ORDER — NICOTINE 21 MG/24HR TD PT24: 21.0000 mg | MEDICATED_PATCH | Freq: Every day | TRANSDERMAL | 0 refills | Status: DC

## 2018-02-12 MED ORDER — CARBAMAZEPINE 100 MG PO CHEW: 100.0000 mg | CHEWABLE_TABLET | Freq: Three times a day (TID) | ORAL | 0 refills | Status: DC

## 2018-02-12 MED ORDER — TRAZODONE HCL 50 MG PO TABS: 50.0000 mg | ORAL_TABLET | Freq: Every evening | ORAL | 0 refills | Status: AC | PRN

## 2018-02-12 MED ORDER — FLUOXETINE HCL 40 MG PO CAPS: 40.0000 mg | ORAL_CAPSULE | Freq: Every day | ORAL | 0 refills | Status: DC

## 2018-02-12 NOTE — Progress Notes (Signed)
  [facility] Adult Case Management Discharge Plan :  Will you be returning to the same living situation after discharge:  Yes,  friend At discharge, do you have transportation home?: Yes,  friend coming at 6pm to pick him up Do you have the ability to pay for your medications: Yes,  mental health  Release of information consent forms completed and submitted to medical records by CSW.   Patient to Follow up at: Follow-up Information    [facility]. Go on 02/20/2018.   Specialty:  Behavioral Health Why:  Your hospital follow up appointment is Thursday, 02/20/18 at 8:00a. Please bring: photo ID, proof of insurance, social security card, any discharge paperwork from this hospitalization.  Contact information: [address] [place] [address] [phone]        Services[facility] Follow up on 02/25/2018.   Why:  Screening for possible admission on Tuesday, 02/25/18. Please arrive by 7:45AM with photo ID/proof of [place] residency, and 30 day supply of all prescribed medications. You may come in as a walk-in on Wed, 1/8 or Thursday 1/9 for possible admit.  Contact information: [address] [place] [address] [phone]        [facility] Follow up.   Specialty:  Addiction Medicine Why:  Referral made: 02/11/2018. If you are interested in being admitted to this facility, please follow-up with [facility] in admissions at discharge. Thank you.  Contact information: [address] [place] [address] [phone]           Next level of care provider has access to [facility] Link:no  Safety Planning and Suicide Prevention discussed: Yes,  SPE Completed with pt; pt declined to consent to collateral contact. SPI pamphlet and mobile crisis information provided    Has patient been referred to the Quitline?: Patient refused referral  Patient has been referred for addiction treatment: Yes  Rona Ravens, LCSW 02/12/2018, 11:53 AM

## 2018-02-12 NOTE — [facility] Suicide Risk Assessment (Signed)
[name] INPATIENT:  Family/Significant Other Suicide Prevention Education  Suicide Prevention Education:  Patient Refusal for Family/Significant Other Suicide Prevention Education: The patient Kenneth Elliott has refused to provide written consent for family/significant other to be provided Family/Significant Other Suicide Prevention Education during admission and/or prior to discharge.  Physician notified.  SPE completed with pt, as pt refused to consent to family contact. SPI pamphlet provided to pt and pt was encouraged to share information with support network, ask questions, and talk about any concerns relating to SPE. Pt denies access to guns/firearms and verbalized understanding of information provided. Mobile Crisis information also provided to pt.   Rona Ravens LCSW 02/12/2018, 11:53 AM

## 2018-02-12 NOTE — Discharge Summary (Signed)
Physician Discharge Summary Note  Patient:  Kenneth Elliott is an 43 y.o., male  MRN:  [id number]  DOB:  May 29, 1975  Patient phone:  [phone] (home)   address:   [address] [place] [address],   Total Time spent with patient: Greater than 30 minutes  Date of Admission:  02/08/2018  Date of Discharge: 02-12-2018  Reason for Admission: Suicidal ideations, has no will to live".    Principal Problem: MDD (major depressive disorder), severe [facility])  Discharge Diagnoses: Patient Active Problem List   Diagnosis Date Noted  . MDD (major depressive disorder), severe (HCC) [F32.2] 01/09/2018  . Adjustment disorder with mixed disturbance of emotions and conduct [F43.25]   . Substance induced mood disorder (HCC) [F19.94] 01/17/2016  . 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 Polysubstance dependence (HCC) [F19.20] 10/05/2015   Past Psychiatric History: Polysubstance dependence  Past Medical History:  Past Medical History:  Diagnosis Date  . Bipolar 1 disorder (HCC)   . Depression   . Hypertension   . Lupus (HCC)   . Rheumatoid arthritis (HCC)   . Seizures (HCC)    per patient   History reviewed. No pertinent surgical history.  Family History: History reviewed. No pertinent family history.  Family Psychiatric  History: See H&P.  History:  Social History   Substance and Sexual Activity  Alcohol Use Yes  . Alcohol/week: 6.0 - 8.0 standard drinks  . Types: 6 - 8 Cans of beer per week   Comment: half gal liquor on the weekends     Social History   Substance and Sexual Activity  Drug Use Yes   Comment: opiates/benzos    Social History   Socioeconomic History  . Marital status: Divorced    Spouse name: Not on file  . Number of children: Not on file  . Years of education: Not on file  . Highest education level: Not on file  Occupational History  . Not on file  Social Needs  . Financial  resource strain: Not on file  . Food insecurity:    Worry: Not on file    Inability: Not on file  . Transportation needs:    Medical: Not on file    Non-medical: Not on file  Tobacco Use  . Smoking status: Current Every Day Smoker    Packs/day: 1.50    Types: Cigarettes  . Smokeless tobacco: Never Used  Substance and Sexual Activity  . Alcohol use: Yes    Alcohol/week: 6.0 - 8.0 standard drinks    Types: 6 - 8 Cans of beer per week    Comment: half gal liquor on the weekends  . Drug use: Yes    Comment: opiates/benzos  . Sexual activity: Yes    Partners: Female    Birth control/protection: None  Lifestyle  . Physical activity:    Days per week: Not on file    Minutes per session: Not on file  . Stress: Not on file  Relationships  . Social connections:    Talks on phone: Not on file    Gets together: Not on file    Attends religious service: Not on file    Active member of club or organization: Not on file    Attends meetings of clubs or organizations: Not on file    Relationship status: Not on file  Other Topics Concern  . Not on file  Social History Narrative  . Not on file  Hospital Course: (Per Md's admission evaluation): Patient is a 43 year old male with a past psychiatric history significant for polysubstance dependence as well as depression who presented to the [facility] emergency department on 02/08/2018 with suicidal ideation and desire to get into a residential treatment program. The patient stated in the emergency department that "I have no will to live".  The patient is familiar to me from her previous psychiatric hospitalization.  He had been previously admitted to our facility on 01/09/2018 after a relapse on substances after approximately 1 to 1-1/2 years of sobriety.  He stated he was only able to remain since sober for 2 to 3 days after the last hospitalization.  He stayed on his medications after discharge, but did not follow-up.  He stated  he would like to get into a long-term treatment program after detox.  Surprisingly his drug screen on admission was completely negative.  His blood alcohol was as well less than 10.  He admitted to helplessness, hopelessness and worthlessness.  He was admitted to the hospital for evaluation and stabilization.  This is one of several discharge summaries from this [facility] for [facility]. He is well known on this unit from previous psychiatric hospitalizations related to substance induced mood disorder & substance disorders. He was recently discharged from [facility], however, Kenneth Elliott failed to comply with his discharged recommendations. He did not follow-up with his outpatient provider & did not continue his mental health medications. He was on this present admission seeking mood stabilization treatments as well as residential treatment program.   After evaluation of his presenting symptoms as noted above, Kenneth Elliott was recommended for mood stabilization treatments. He received & was discharged on; Seroquel 200 mg for mood control, Hydroxyzine 25 mg prn for anxiety, Gabapentin 800 mg for agitation/substance withdrawal symptoms, Prozac 40 mg for depression, Tegretol 100 mg for mood stabilization, Nicotine patch 21 mg for nicotine withdrawal & Trazodone 50 mg prn for insomnia. He was resumed & discharged on all his pertinent home medications for his other pre-existing medical issues reported. Kenneth Elliott was also enrolled & participated in the group counseling sessions being offered & held on this unit. He learned coping skills. Part of his discharge plans is a referral to a long term substance abuse treatment center for further substance abuse treatment. He has a scheduled date for [facility] residential treatment center screening.  Kenneth Elliott's symptoms responded well to his treatment regimen. He is currently mentally & medically stable to be discharged to continue further substance abuse treatment as noted below. Upon discharge, he  adamantly denies any SIHI, AVH, delusional thoughts or paranoia. He was provided with a 7 days worth, supply samples of his [facility] discharge medications. He was able to engage in safety planning including plan to return to [facility] or contact emergency services if he feels unable to maintain his own safety or the safety of others. Pt had no further questions, comments or concerns.  He left [facility] with all personal belongings in no apparent distress.    Physical Findings: AIMS: Facial and Oral Movements Muscles of Facial Expression: None, normal Lips and Perioral Area: None, normal Jaw: None, normal Tongue: None, normal,Extremity Movements Upper (arms, wrists, hands, fingers): None, normal Lower (legs, knees, ankles, toes): None, normal, Trunk Movements Neck, shoulders, hips: None, normal, Overall Severity Severity of abnormal movements (highest score from questions above): None, normal Incapacitation due to abnormal movements: None, normal Patient's awareness of abnormal movements (rate only patient's report): No Awareness, Dental Status Current problems with teeth  and/or dentures?: No Does patient usually wear dentures?: No  CIWA:  CIWA-Ar Total: 1 COWS:     Musculoskeletal: Strength & Muscle Tone: within normal limits Gait & Station: normal Patient leans: N/A  Psychiatric Specialty Exam: Physical Exam  Nursing note and vitals reviewed. Constitutional: He appears well-developed.  HENT:  Head: Normocephalic.  Eyes: Pupils are equal, round, and reactive to light.  Neck: Normal range of motion.  Cardiovascular: Normal rate.  Respiratory: Effort normal.  GI: Soft.  Genitourinary:    Genitourinary Comments: Deferred   Musculoskeletal: Normal range of motion.  Neurological: He is alert.  Skin: Skin is warm.    Review of Systems  Constitutional: Negative.   HENT: Negative.   Eyes: Negative.   Respiratory: Negative.  Negative for cough and shortness of breath.   Cardiovascular: Negative.   Negative for chest pain and palpitations.  Gastrointestinal: Negative.  Negative for abdominal pain, heartburn, nausea and vomiting.  Genitourinary: Negative.   Musculoskeletal: Negative.   Skin: Negative.   Neurological: Negative.  Negative for dizziness and headaches.  Endo/Heme/Allergies: Negative.   Psychiatric/Behavioral: Positive for depression (Stable) and substance abuse (Hx. Polysubstance dependence). Negative for hallucinations, memory loss and suicidal ideas. The patient has insomnia (Stable ). The patient is not nervous/anxious (Stable).     Blood pressure 116/77, pulse 68, temperature 97.6 F (36.4 C), temperature source Oral, resp. rate 18, height 5\' 9"  (1.753 m), weight 84.4 kg, SpO2 100 %.Body mass index is 27.47 kg/m.  See Md's discharge SRA   Has this patient used any form of tobacco in the last 30 days? (Cigarettes, Smokeless Tobacco, Cigars, and/or Pipes): Yes, provided with Nicotine patch prescription.  Blood Alcohol level:  Lab Results  Component Value Date   ETH <10 02/08/2018   ETH 97 (H) 01/08/2018   Metabolic Disorder Labs:  No results found for: HGBA1C, MPG No results found for: PROLACTIN No results found for: CHOL, TRIG, HDL, CHOLHDL, VLDL, LDLCALC  See Psychiatric Specialty Exam and Suicide Risk Assessment completed by Attending Physician prior to discharge.  Discharge destination:  Home  Is patient on multiple antipsychotic therapies at discharge:  No   Has Patient had three or more failed trials of antipsychotic monotherapy by history:  No  Recommended Plan for Multiple Antipsychotic Therapies: NA Discharge Instructions    Diet - low sodium heart healthy   Complete by:  As directed    Discharge instructions   Complete by:  As directed    Patient is instructed prior to discharge to: Take all medications as prescribed by his/her mental healthcare provider. Report any adverse effects and or reactions from the medicines to his/her outpatient  provider promptly. Patient has been instructed & cautioned: To not engage in alcohol and or illegal drug use while on prescription medicines. In the event of worsening symptoms, patient is instructed to call the crisis hotline, 911 and or go to the nearest ED for appropriate evaluation and treatment of symptoms. To follow-up with his/her primary care provider for your other medical issues, concerns and or health care needs.   Increase activity slowly   Complete by:  As directed      Allergies as of 02/12/2018   No Known Allergies     Medication List    STOP taking these medications   mirtazapine 7.5 MG tablet Commonly known as:  REMERON     TAKE these medications     Indication  carbamazepine 100 MG chewable tablet Commonly known as:  TEGRETOL Chew 1  tablet (100 mg total) by mouth 3 (three) times daily. For mood stabilization  Indication:  Mood stabilization   FLUoxetine 40 MG capsule Commonly known as:  PROZAC Take 1 capsule (40 mg total) by mouth daily. For depression What changed:  additional instructions  Indication:  Major Depressive Disorder   gabapentin 400 MG capsule Commonly known as:  NEURONTIN Take 2 capsules (800 mg total) by mouth 3 (three) times daily. For agitation What changed:  additional instructions  Indication:  Agitation, Alcohol Withdrawal Syndrome   hydrOXYzine 25 MG tablet Commonly known as:  ATARAX/VISTARIL Take 1 tablet (25 mg total) by mouth 3 (three) times daily as needed for anxiety.  Indication:  Feeling Anxious   metoprolol succinate 100 MG 24 hr tablet Commonly known as:  TOPROL-XL Take 1 tablet (100 mg total) by mouth daily. Take with or immediately following a meal: For high blood pressure Start taking on:  February 13, 2018 What changed:  additional instructions  Indication:  High Blood Pressure Disorder   nicotine 21 mg/24hr patch Commonly known as:  NICODERM CQ - dosed in mg/24 hours Place 1 patch (21 mg total) onto the skin daily.  (May buy from over the counter): For smoking cessation What changed:  additional instructions  Indication:  Nicotine Addiction   QUEtiapine 200 MG tablet Commonly known as:  SEROQUEL Take 1 tablet (200 mg total) by mouth at bedtime. For mood control  Indication:  Mood control   traZODone 50 MG tablet Commonly known as:  DESYREL Take 1 tablet (50 mg total) by mouth at bedtime as needed for sleep.  Indication:  Trouble Sleeping      Follow-up [employer]    [facility]. Go on 02/20/2018.   Specialty:  Behavioral Health Why:  Your hospital follow up appointment is Thursday, 02/20/18 at 8:00a. Please bring: photo ID, proof of insurance, social security card, any discharge paperwork from this hospitalization.  Contact information: [address] [place] [address] [phone]        [facility] Follow up on 02/25/2018.   Why:  Screening for possible admission on Tuesday, 02/25/18. Please arrive by 7:45AM with photo ID/proof of [place] residency, and 30 day supply of all prescribed medications. You may come in as a walk-in on Wed, 1/8 or Thursday 1/9 for possible admit.  Contact information: [address] [place] [address] [phone]        [facility] Follow up.   Specialty:  Addiction Medicine Why:  Referral made: 02/11/2018. If you are interested in being admitted to this facility, [facility] at discharge. Thank you.  Contact information: [address] [place] [address] [phone]          Follow-up recommendations: Activity:  As tolerated Diet: As recommended by your primary care doctor. Keep all scheduled follow-up appointments as recommended.   Comments: Patient is instructed prior to discharge to: Take all medications as prescribed by his/her mental healthcare provider. Report any adverse effects and or reactions from the medicines to his/her outpatient provider  promptly. Patient has been instructed & cautioned: To not engage in alcohol and or illegal drug use while on prescription medicines. In the event of worsening symptoms, patient is instructed to call the crisis hotline, 911 and or go to the nearest ED for appropriate evaluation and treatment of symptoms. To follow-up with his/her primary care provider for your other medical issues, concerns and or health care needs.  Signed: Armandina Stammer, NP, PMHNP, FNP-BC 02/12/2018, 10:30 AM

## 2018-02-12 NOTE — BHH Suicide Risk Assessment (Signed)
[facility] Discharge Suicide Risk Assessment   Principal Problem: MDD (major depressive disorder), severe (HCC) Discharge Diagnoses: Principal Problem:   MDD (major depressive disorder), severe (HCC) Active Problems:   Polysubstance dependence (HCC)  Current mental status exam alert oriented without thoughts of harming self or others no cravings tremors or withdrawal no psychosis  Total Time spent with patient: 30 minutes   Mental Status Per Nursing Assessment::   On Admission:  Suicidal ideation indicated by patient  Demographic Factors:  Male  Loss Factors: Decrease in vocational status  Historical Factors: NA  Risk Reduction Factors:   Religious beliefs about death  Continued Clinical Symptoms:  Alcohol/Substance Abuse/Dependencies  Cognitive Features That Contribute To Risk:  None    Suicide Risk:  Minimal: No identifiable suicidal ideation.  Patients presenting with no risk factors but with morbid ruminations; may be classified as minimal risk based on the severity of the depressive symptoms  Follow-up Information    [facility]. Go on 02/20/2018.   Specialty:  Behavioral Health Why:  Your hospital follow up appointment is Thursday, 02/20/18 at 8:00a. Please bring: photo ID, proof of insurance, social security card, any discharge paperwork from this hospitalization.  Contact information: [address] [place] [address] [phone]        [facility] Follow up on 02/25/2018.   Why:  Screening for possible admission on Tuesday, 02/25/18. Please arrive by 7:45AM with photo ID/proof of [place] residency, and 30 day supply of all prescribed medications. You may come in as a walk-in on Wed, 1/8 or Thursday 1/9 for possible admit.  Contact information: [address] [place] [address] [phone]        [facility] Follow up.   Specialty:  Addiction Medicine Why:  Referral made: 02/11/2018. If you are  interested in being admitted to this facility, please follow-up with [facility] in admissions at discharge. Thank you.  Contact information: [address] [place] [address] [phone]           Plan Of Care/Follow-up recommendations:  Activity:  full  Jhoselyn Ruffini, MD 02/12/2018, 7:54 AM

## 2018-02-12 NOTE — Progress Notes (Signed)
Recreation Therapy Notes  Date: 1.8.20 Time: 0930 Location: [address] Dayroom  Group Topic: Stress Management  Goal Area(s) Addresses:  Patient will identify benefits of stress management. Patient will identify stress management techniques. Patient will identify benefits of using stress management post d/c.   Intervention: Stress Management  Activity :  Guided Imagery.  LRT introduced the stress management technique of guided imagery.  LRT read a script that took patients on a journey through a wildlife sanctuary.  Patients were to follow along as script was read to engage in activity.  Education:  Stress Management, Discharge Planning.   Education Outcome: Acknowledges Education  Clinical Observations/Feedback:  Pt did not attend group.    Rorie Delmore, LRT/CTRS         Elita Dame A 02/12/2018 10:42 AM 

## 2018-02-12 NOTE — Plan of Care (Addendum)
Patient self inventory- Patient slept fair last night, sleep medication was requested and was helpful. Appetite is fair, energy level normal. Depression, hopelessness, and anxiety rated 4, 1, 5, out of 10. Denies withdrawal symptoms. Denies SI HI AVH. Endorses physical pain 6/10 in hid hands, left arm, and back. Medication has been helpful. Patient's goal is "leaving." Patient is compliant with medications prescribed per provider. No side effects noted. Safety is maintained with 15 minute checks as well as environmental checks. Will continue to monitor and provide support.   Problem: Education: Goal: Knowledge of [facility] Education information/materials will improve Outcome: Adequate for Discharge   Problem: Education: Goal: Emotional status will improve Outcome: Adequate for Discharge   Problem: Education: Goal: Mental status will improve Outcome: Adequate for Discharge   Problem: Education: Goal: Verbalization of understanding the information provided will improve Outcome: Adequate for Discharge

## 2018-02-19 ENCOUNTER — Emergency Department (HOSPITAL_COMMUNITY)
Admission: EM | Admit: 2018-02-19 | Discharge: 2018-02-19 | Disposition: A | Discharge status: Home / Self Care | Payer: Self-pay | Attending: Emergency Medicine | Admitting: Emergency Medicine

## 2018-02-19 ENCOUNTER — Encounter (HOSPITAL_COMMUNITY): Payer: Self-pay | Admitting: Emergency Medicine

## 2018-02-19 ENCOUNTER — Emergency Department (HOSPITAL_COMMUNITY): Payer: Self-pay

## 2018-02-19 DIAGNOSIS — R197 Diarrhea, unspecified: Secondary | ICD-10-CM | POA: Insufficient documentation

## 2018-02-19 DIAGNOSIS — F17210 Nicotine dependence, cigarettes, uncomplicated: Secondary | ICD-10-CM | POA: Insufficient documentation

## 2018-02-19 DIAGNOSIS — Z79899 Other long term (current) drug therapy: Secondary | ICD-10-CM | POA: Insufficient documentation

## 2018-02-19 DIAGNOSIS — I10 Essential (primary) hypertension: Secondary | ICD-10-CM | POA: Insufficient documentation

## 2018-02-19 LAB — COMPREHENSIVE METABOLIC PANEL
ALT: 83 U/L — ABNORMAL HIGH (ref 0–44)
AST: 40 U/L (ref 15–41)
Albumin: 3.8 g/dL (ref 3.5–5.0)
Alkaline Phosphatase: 77 U/L (ref 38–126)
Anion gap: 8 (ref 5–15)
BUN: 10 mg/dL (ref 6–20)
CO2: 27 mmol/L (ref 22–32)
Calcium: 9 mg/dL (ref 8.9–10.3)
Chloride: 105 mmol/L (ref 98–111)
Creatinine, Ser: 1.13 mg/dL (ref 0.61–1.24)
GFR calc Af Amer: >60 mL/min (ref >60)
Glucose, Bld: 101 mg/dL — ABNORMAL HIGH (ref 70–99)
Potassium: 4.7 mmol/L (ref 3.5–5.1)
Sodium: 140 mmol/L (ref 135–145)
Total Bilirubin: 0.3 mg/dL (ref 0.3–1.2)
Total Protein: 7.7 g/dL (ref 6.5–8.1)

## 2018-02-19 LAB — URINALYSIS, ROUTINE W REFLEX MICROSCOPIC
Bilirubin Urine: NEGATIVE
Glucose, UA: NEGATIVE mg/dL
Hgb urine dipstick: NEGATIVE
Ketones, ur: NEGATIVE mg/dL
LEUKOCYTES UA: NEGATIVE
Nitrite: NEGATIVE
Protein, ur: NEGATIVE mg/dL
Specific Gravity, Urine: 1.018 (ref 1.005–1.030)
pH: 6 (ref 5.0–8.0)

## 2018-02-19 LAB — CBC
HCT: 45.8 % (ref 39.0–52.0)
Hemoglobin: 14 g/dL (ref 13.0–17.0)
MCH: 29.2 pg (ref 26.0–34.0)
MCHC: 30.6 g/dL (ref 30.0–36.0)
MCV: 95.6 fL (ref 80.0–100.0)
Platelets: 247 10*3/uL (ref 150–400)
RBC: 4.79 MIL/uL (ref 4.22–5.81)
RDW: 14.1 % (ref 11.5–15.5)
WBC: 8.2 10*3/uL (ref 4.0–10.5)
nRBC: 0 % (ref 0.0–0.2)

## 2018-02-19 LAB — LIPASE, BLOOD: Lipase: 37 U/L (ref 11–51)

## 2018-02-19 MED ORDER — SODIUM CHLORIDE 0.9 % IV BOLUS
1000.0000 mL | Freq: Once | INTRAVENOUS | Status: AC
  Administered 2018-02-19: 1000 mL via INTRAVENOUS

## 2018-02-19 MED ORDER — SODIUM CHLORIDE 0.9% FLUSH
3.0000 mL | Freq: Once | INTRAVENOUS | Status: AC
  Administered 2018-02-19: 3 mL via INTRAVENOUS

## 2018-02-19 MED ORDER — IOHEXOL 300 MG/ML  SOLN
100.0000 mL | Freq: Once | INTRAMUSCULAR | Status: AC | PRN
  Administered 2018-02-19: 100 mL via INTRAVENOUS

## 2018-02-19 NOTE — ED Notes (Signed)
Patient transported to CT 

## 2018-02-19 NOTE — ED Provider Notes (Signed)
[facility] EMERGENCY DEPARTMENT Provider Note   CSN: [id number] Arrival date & time: 02/19/18  1308     History   Chief Complaint Chief Complaint  Patient presents with  . Abdominal Pain    HPI Kenneth Elliott is a 43 y.o. male.  43 year old male with prior medical history as detailed below presents for evaluation of diarrhea with associated left lower quadrant abdominal pain.  Symptoms started over the last 2 days.  He denies associated fever.  He denies nausea or vomiting.  He denies bloody stool.  He denies prior abdominal surgeries.  He describes very loose watery stools.  He has not taken any help with symptoms.  The history is provided by the patient and medical records.  Diarrhea  Quality:  Copious and watery Severity:  Mild Onset quality:  Gradual Duration:  2 days Timing:  Constant Progression:  Unchanged Relieved by:  Nothing Worsened by:  Nothing Ineffective treatments:  None tried Associated symptoms: abdominal pain   Risk factors: no recent antibiotic use, no sick contacts, no suspicious food intake and no travel to endemic areas     Past Medical History:  Diagnosis Date  . Bipolar 1 disorder (HCC)   . Depression   . Hypertension   . Lupus (HCC)   . Rheumatoid arthritis (HCC)   . Seizures (HCC)    per patient    Patient Active Problem List   Diagnosis Date Noted  . MDD (major depressive disorder), severe (HCC) 01/09/2018  . Adjustment disorder with mixed disturbance of emotions and conduct   . Substance induced mood disorder (HCC) 01/17/2016  . Severe episode of recurrent major depressive disorder, without psychotic features (HCC)   . Polysubstance dependence (HCC) 10/05/2015    History reviewed. No pertinent surgical history.      Home Medications    Prior to Admission medications   Medication Sig Start Date End Date Taking? Authorizing Provider  carbamazepine (TEGRETOL) 100 MG chewable tablet Chew 1 tablet (100 mg  total) by mouth 3 (three) times daily. For mood stabilization 02/12/18   Armandina Stammer I, NP  FLUoxetine (PROZAC) 40 MG capsule Take 1 capsule (40 mg total) by mouth daily. For depression 02/12/18   Armandina Stammer I, NP  gabapentin (NEURONTIN) 400 MG capsule Take 2 capsules (800 mg total) by mouth 3 (three) times daily. For agitation 02/12/18   Armandina Stammer I, NP  hydrOXYzine (ATARAX/VISTARIL) 25 MG tablet Take 1 tablet (25 mg total) by mouth 3 (three) times daily as needed for anxiety. 02/12/18   Armandina Stammer I, NP  metoprolol succinate (TOPROL-XL) 100 MG 24 hr tablet Take 1 tablet (100 mg total) by mouth daily. Take with or immediately following a meal: For high blood pressure 02/13/18   Nwoko, Nicole Kindred I, NP  nicotine (NICODERM CQ - DOSED IN MG/24 HOURS) 21 mg/24hr patch Place 1 patch (21 mg total) onto the skin daily. (May buy from over the counter): For smoking cessation 02/12/18   Armandina Stammer I, NP  QUEtiapine (SEROQUEL) 200 MG tablet Take 1 tablet (200 mg total) by mouth at bedtime. For mood control 02/12/18   Armandina Stammer I, NP  traZODone (DESYREL) 50 MG tablet Take 1 tablet (50 mg total) by mouth at bedtime as needed for sleep. 02/12/18   Sanjuana Kava, NP    Family History No family history on file.  Social History Social History   Tobacco Use  . Smoking status: Current Every Day Smoker    Packs/day:  1.50    Types: Cigarettes  . Smokeless tobacco: Never Used  Substance Use Topics  . Alcohol use: Yes    Alcohol/week: 6.0 - 8.0 standard drinks    Types: 6 - 8 Cans of beer per week    Comment: half gal liquor on the weekends  . Drug use: Yes    Comment: opiates/benzos     Allergies   Patient has no known allergies.   Review of Systems Review of Systems  Gastrointestinal: Positive for abdominal pain and diarrhea.  All other systems reviewed and are negative.    Physical Exam Updated Vital Signs BP (!) 164/96 (BP Location: Right Arm)   Pulse 66   Resp 16   SpO2 100%   Physical  Exam Vitals signs and nursing note reviewed.  Constitutional:      General: He is not in acute distress.    Appearance: He is well-developed.  HENT:     Head: Normocephalic and atraumatic.  Eyes:     Conjunctiva/sclera: Conjunctivae normal.     Pupils: Pupils are equal, round, and reactive to light.  Neck:     Musculoskeletal: Normal range of motion and neck supple.  Cardiovascular:     Rate and Rhythm: Normal rate and regular rhythm.     Heart sounds: Normal heart sounds.  Pulmonary:     Effort: Pulmonary effort is normal. No respiratory distress.     Breath sounds: Normal breath sounds.  Abdominal:     General: Bowel sounds are normal. There is no distension.     Palpations: Abdomen is soft.     Tenderness: There is abdominal tenderness in the left lower quadrant.     Comments: Mild tenderness to palpation in the left lower quadrant  Musculoskeletal: Normal range of motion.        General: No deformity.  Skin:    General: Skin is warm and dry.  Neurological:     General: No focal deficit present.     Mental Status: He is alert and oriented to person, place, and time.      ED Treatments / Results  Labs (all labs ordered are listed, but only abnormal results are displayed) Labs Reviewed  COMPREHENSIVE METABOLIC PANEL - Abnormal; Notable for the following components:      Result Value   Glucose, Bld 101 (*)    ALT 83 (*)    All other components within normal limits  LIPASE, BLOOD  CBC  URINALYSIS, ROUTINE W REFLEX MICROSCOPIC    EKG None  Radiology Ct Abdomen Pelvis W Contrast  Result Date: 02/19/2018 CLINICAL DATA:  Left lower quadrant pain starting yesterday associated with diarrhea. EXAM: CT ABDOMEN AND PELVIS WITH CONTRAST TECHNIQUE: Multidetector CT imaging of the abdomen and pelvis was performed using the standard protocol following bolus administration of intravenous contrast. CONTRAST:  OMNIPAQUE IOHEXOL 300 MG/ML  SOLN COMPARISON:  None. FINDINGS:  Lower chest: No acute abnormality. Hepatobiliary: No focal liver abnormality is seen. No gallstones, gallbladder wall thickening, or biliary dilatation. Pancreas: Unremarkable. No pancreatic ductal dilatation or surrounding inflammatory changes. Spleen: Normal in size without focal abnormality. Adrenals/Urinary Tract: No adrenal masses. Kidneys normal size, orientation and position with symmetric enhancement. 4 mm low-density lesion, medial midpole right kidney, consistent with a cyst. Tiny nonobstructing stone in the lower pole the right kidney. No other renal masses or stones. No hydronephrosis. Ureters normal course and in caliber.  Bladder is unremarkable. Stomach/Bowel: Normal stomach. Small bowel and colon are normal in caliber.  No wall thickening. No inflammation. Specifically, no diverticulitis to account for left lower quadrant pain. Normal appendix visualized. Vascular/Lymphatic: Minimal aortic atherosclerosis. No other vascular abnormality. No enlarged lymph nodes. Reproductive: Unremarkable. Other: No abdominal wall hernia or abnormality. No abdominopelvic ascites. Musculoskeletal: No acute or significant osseous findings. IMPRESSION: 1. No acute findings within the abdomen or pelvis. No findings to account for left lower quadrant pain. 2. Tiny low-density right renal lesion consistent with a cyst. Tiny nonobstructing stone in the lower pole the right kidney. 3. Minor aortic atherosclerosis.  No other abnormalities. Electronically Signed   By: Amie Portlandavid  Ormond M.D.   On: 02/19/2018 18:04    Procedures Procedures (including critical care time)  Medications Ordered in ED Medications  sodium chloride flush (NS) 0.9 % injection 3 mL (3 mLs Intravenous Given 02/19/18 1605)  sodium chloride 0.9 % bolus 1,000 mL (1,000 mLs Intravenous New Bag/Given 02/19/18 1605)  iohexol (OMNIPAQUE) 300 MG/ML solution 100 mL (100 mLs Intravenous Contrast Given 02/19/18 1749)     Initial Impression / Assessment and Plan  / ED Course  I have reviewed the triage vital signs and the nursing notes.  Pertinent labs & imaging results that were available during my care of the patient were reviewed by me and considered in my medical decision making (see chart for details).     MDM  Screen complete  Patient is presenting for evaluation of left lower quadrant pain associated with diarrhea.  Symptoms are most consistent with likely viral enteritis.  Screening labs obtained are without significant abnormality.  CT imaging does not reveal acute pathology.  Patient does feel improved following IV fluids.    He understands the need for close follow-up.  Strict return precautions given and understood.  Final Clinical Impressions(s) / ED Diagnoses   Final diagnoses:  Diarrhea, unspecified type    ED Discharge Orders    None       Wynetta FinesMessick, Natoria Archibald C, MD 02/19/18 1826

## 2018-02-19 NOTE — Discharge Instructions (Addendum)
Please return for any problem.  Follow-up with your regular care provider as instructed.  Drink plenty of fluids. 

## 2018-02-19 NOTE — ED Notes (Signed)
Patient verbalizes understanding of discharge instructions. Opportunity for questioning and answers were provided. Armband removed by staff, pt discharged from ED. Pt ambulatory to lobby.  

## 2018-02-19 NOTE — ED Triage Notes (Signed)
Pt reports LLQ pain that started yesterday with diarrhea.

## 2018-03-15 ENCOUNTER — Encounter (HOSPITAL_COMMUNITY): Payer: Self-pay | Admitting: Student

## 2018-03-15 ENCOUNTER — Other Ambulatory Visit: Payer: Self-pay

## 2018-03-15 ENCOUNTER — Emergency Department (HOSPITAL_COMMUNITY)
Admission: EM | Admit: 2018-03-15 | Discharge: 2018-03-16 | Disposition: A | Discharge status: Home / Self Care | Payer: Self-pay | Attending: Emergency Medicine | Admitting: Emergency Medicine

## 2018-03-15 DIAGNOSIS — Z79899 Other long term (current) drug therapy: Secondary | ICD-10-CM | POA: Insufficient documentation

## 2018-03-15 DIAGNOSIS — I10 Essential (primary) hypertension: Secondary | ICD-10-CM | POA: Insufficient documentation

## 2018-03-15 DIAGNOSIS — F1099 Alcohol use, unspecified with unspecified alcohol-induced disorder: Secondary | ICD-10-CM | POA: Insufficient documentation

## 2018-03-15 DIAGNOSIS — R7989 Other specified abnormal findings of blood chemistry: Secondary | ICD-10-CM

## 2018-03-15 DIAGNOSIS — F332 Major depressive disorder, recurrent severe without psychotic features: Secondary | ICD-10-CM | POA: Insufficient documentation

## 2018-03-15 DIAGNOSIS — F17210 Nicotine dependence, cigarettes, uncomplicated: Secondary | ICD-10-CM | POA: Insufficient documentation

## 2018-03-15 DIAGNOSIS — R945 Abnormal results of liver function studies: Secondary | ICD-10-CM

## 2018-03-15 DIAGNOSIS — R739 Hyperglycemia, unspecified: Secondary | ICD-10-CM

## 2018-03-15 DIAGNOSIS — F1199 Opioid use, unspecified with unspecified opioid-induced disorder: Secondary | ICD-10-CM | POA: Insufficient documentation

## 2018-03-15 DIAGNOSIS — R45851 Suicidal ideations: Secondary | ICD-10-CM

## 2018-03-15 DIAGNOSIS — F4325 Adjustment disorder with mixed disturbance of emotions and conduct: Secondary | ICD-10-CM

## 2018-03-15 LAB — COMPREHENSIVE METABOLIC PANEL
ALT: 99 U/L — ABNORMAL HIGH (ref 0–44)
AST: 50 U/L — ABNORMAL HIGH (ref 15–41)
Albumin: 3.9 g/dL (ref 3.5–5.0)
Alkaline Phosphatase: 75 U/L (ref 38–126)
Anion gap: 11 (ref 5–15)
BUN: 9 mg/dL (ref 6–20)
CO2: 23 mmol/L (ref 22–32)
Calcium: 9 mg/dL (ref 8.9–10.3)
Chloride: 104 mmol/L (ref 98–111)
Creatinine, Ser: 1.22 mg/dL (ref 0.61–1.24)
GFR calc non Af Amer: >60 mL/min (ref >60)
Glucose, Bld: 165 mg/dL — ABNORMAL HIGH (ref 70–99)
Potassium: 4.4 mmol/L (ref 3.5–5.1)
Sodium: 138 mmol/L (ref 135–145)
Total Bilirubin: 0.4 mg/dL (ref 0.3–1.2)
Total Protein: 7.3 g/dL (ref 6.5–8.1)

## 2018-03-15 LAB — CBC
HCT: 42.5 % (ref 39.0–52.0)
HEMOGLOBIN: 13.9 g/dL (ref 13.0–17.0)
MCH: 30.2 pg (ref 26.0–34.0)
MCHC: 32.7 g/dL (ref 30.0–36.0)
MCV: 92.2 fL (ref 80.0–100.0)
Platelets: 131 10*3/uL — ABNORMAL LOW (ref 150–400)
RBC: 4.61 MIL/uL (ref 4.22–5.81)
RDW: 13.6 % (ref 11.5–15.5)
WBC: 8.3 10*3/uL (ref 4.0–10.5)
nRBC: 0 % (ref 0.0–0.2)

## 2018-03-15 LAB — RAPID URINE DRUG SCREEN, HOSP PERFORMED
Amphetamines: NOT DETECTED
BENZODIAZEPINES: NOT DETECTED
Barbiturates: NOT DETECTED
Cocaine: NOT DETECTED
Opiates: NOT DETECTED
Tetrahydrocannabinol: NOT DETECTED

## 2018-03-15 LAB — ETHANOL: Alcohol, Ethyl (B): <10 mg/dL (ref <10)

## 2018-03-15 MED ORDER — QUETIAPINE FUMARATE 200 MG PO TABS
200.0000 mg | ORAL_TABLET | Freq: Every day | ORAL | Status: DC
  Administered 2018-03-15: 200 mg via ORAL
  Filled 2018-03-15: qty 1

## 2018-03-15 MED ORDER — ONDANSETRON HCL 4 MG PO TABS: 4.0000 mg | ORAL_TABLET | Freq: Three times a day (TID) | ORAL | Status: DC | PRN

## 2018-03-15 MED ORDER — NICOTINE 21 MG/24HR TD PT24: 21.0000 mg | MEDICATED_PATCH | Freq: Every day | TRANSDERMAL | Status: DC

## 2018-03-15 MED ORDER — LORAZEPAM 2 MG/ML IJ SOLN: 0.0000 mg | Freq: Four times a day (QID) | INTRAMUSCULAR | Status: DC

## 2018-03-15 MED ORDER — NICOTINE 14 MG/24HR TD PT24
14.0000 mg | MEDICATED_PATCH | Freq: Every day | TRANSDERMAL | Status: DC
  Administered 2018-03-16: 14 mg via TRANSDERMAL
  Filled 2018-03-15: qty 1

## 2018-03-15 MED ORDER — FLUOXETINE HCL 20 MG PO CAPS
40.0000 mg | ORAL_CAPSULE | Freq: Every day | ORAL | Status: DC
  Administered 2018-03-16: 40 mg via ORAL
  Filled 2018-03-15: qty 2

## 2018-03-15 MED ORDER — IBUPROFEN 400 MG PO TABS: 600.0000 mg | ORAL_TABLET | Freq: Three times a day (TID) | ORAL | Status: DC | PRN

## 2018-03-15 MED ORDER — THIAMINE HCL 100 MG/ML IJ SOLN: 100.0000 mg | Freq: Every day | INTRAMUSCULAR | Status: DC

## 2018-03-15 MED ORDER — HYDROXYZINE HCL 25 MG PO TABS
25.0000 mg | ORAL_TABLET | Freq: Three times a day (TID) | ORAL | Status: DC | PRN
  Administered 2018-03-15 (×2): 25 mg via ORAL
  Filled 2018-03-15 (×2): qty 1

## 2018-03-15 MED ORDER — METOPROLOL SUCCINATE ER 25 MG PO TB24
100.0000 mg | ORAL_TABLET | Freq: Every day | ORAL | Status: DC
  Administered 2018-03-16: 100 mg via ORAL
  Filled 2018-03-15 (×2): qty 4

## 2018-03-15 MED ORDER — LORAZEPAM 2 MG/ML IJ SOLN: 0.0000 mg | Freq: Two times a day (BID) | INTRAMUSCULAR | Status: DC

## 2018-03-15 MED ORDER — CARBAMAZEPINE 100 MG PO CHEW
100.0000 mg | CHEWABLE_TABLET | Freq: Three times a day (TID) | ORAL | Status: DC
  Administered 2018-03-15: 100 mg via ORAL
  Filled 2018-03-15 (×2): qty 1

## 2018-03-15 MED ORDER — LORAZEPAM 1 MG PO TABS: 0.0000 mg | ORAL_TABLET | Freq: Two times a day (BID) | ORAL | Status: DC

## 2018-03-15 MED ORDER — ZOLPIDEM TARTRATE 5 MG PO TABS: 5.0000 mg | ORAL_TABLET | Freq: Every evening | ORAL | Status: DC | PRN

## 2018-03-15 MED ORDER — LORAZEPAM 1 MG PO TABS: 0.0000 mg | ORAL_TABLET | Freq: Four times a day (QID) | ORAL | Status: DC

## 2018-03-15 MED ORDER — CARBAMAZEPINE 100 MG PO CHEW
200.0000 mg | CHEWABLE_TABLET | Freq: Two times a day (BID) | ORAL | Status: DC
  Administered 2018-03-15 – 2018-03-16 (×2): 200 mg via ORAL
  Filled 2018-03-15 (×2): qty 2

## 2018-03-15 MED ORDER — ALUM & MAG HYDROXIDE-SIMETH 200-200-20 MG/5ML PO SUSP: 30.0000 mL | Freq: Four times a day (QID) | ORAL | Status: DC | PRN

## 2018-03-15 MED ORDER — TRAZODONE HCL 50 MG PO TABS
50.0000 mg | ORAL_TABLET | Freq: Every evening | ORAL | Status: DC | PRN
  Administered 2018-03-15: 50 mg via ORAL
  Filled 2018-03-15: qty 1

## 2018-03-15 MED ORDER — GABAPENTIN 300 MG PO CAPS
300.0000 mg | ORAL_CAPSULE | Freq: Three times a day (TID) | ORAL | Status: DC
  Administered 2018-03-15 – 2018-03-16 (×3): 300 mg via ORAL
  Filled 2018-03-15 (×3): qty 1

## 2018-03-15 MED ORDER — VITAMIN B-1 100 MG PO TABS
100.0000 mg | ORAL_TABLET | Freq: Every day | ORAL | Status: DC
  Administered 2018-03-15 – 2018-03-16 (×2): 100 mg via ORAL
  Filled 2018-03-15 (×2): qty 1

## 2018-03-15 NOTE — ED Notes (Signed)
Pt noted w/Nicotine 14mg  to left deltoid - states applied this am. Pt requesting Nicotine 14mg  patch to be applied when time rather than 21mg  d/t attempting to taper.

## 2018-03-15 NOTE — BH Assessment (Signed)
Assessment Note  Kenneth FinnerBrandon E Elliott is an 43 y.o. male., who reports asking Girl Friend [facility]  Patient presented orientated x4, mood, "depressed and anxious", affect congruent with mood.  Patient reports current SI with plan to cut wrist or throat, or to overdose.  He denied HI.  Patient reports VH of lights and spots periodically and AH of ringing in his ears.  Patient reports being adherent to current prescribed medcations and "they are not working."  He reports taking 40 mg of Prozac, Tegretol 3x per day, and 80 mg of Gabapentin daily.  He reports being prescribed medications by PCP at [facility].  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  He reports experiencing high anxiety 10/10.  Patient reports daily alcohol consumption of 12 (12 oz) beers.  He also reports snorting or chewing 60 to 70 mg of Vicodin tablets daily.  Patient reports recently completing [facility] [place] [place] and relapsing to substance use on the same day.  Patient [facility], [facility], and 2x in 2017 for MDD, recurrent, severe.   The Patient is requesting inpatient psychiatric treatment.  Per Assunta FoundShuvon Rankin, NP:  The Patient  Meet inpatient criteria and placement should be sought.  The Patient's disposition was provided to PA-C Petrucelli            Diagnosis:  Major Depressive Disorder, recurrent, severe; Opioid Use Disorder, severe; Alcohol Use Disorder, severe  Past Medical History:  Past Medical History:  Diagnosis Date  . Bipolar 1 disorder (HCC)   . Depression   . Hypertension   . Lupus (HCC)   . Rheumatoid arthritis (HCC)   . Seizures (HCC)    per patient    History reviewed. No pertinent surgical history.  Family History: History reviewed. No pertinent family history.  Social History:  reports that he has been smoking cigarettes. He has been smoking about 1.50 packs  per day. He has never used smokeless tobacco. He reports current alcohol use of about 6.0 - 8.0 standard drinks of alcohol per week. He reports current drug use.  Additional Social History:  Substance #1 Name of Substance 1: Vicidin 1 - Amount (size/oz): 60 to 70 mg 1 - Frequency: daily 1 - Last Use / Amount: 5 to 6 days ago Substance #3 Name of Substance 3: Beer 3 - Amount (size/oz): 12 (12oz)  3 - Frequency: daily 3 - Duration: ongoing 3 - Last Use / Amount:  5 to 6 days ago  CIWA: CIWA-Ar BP: (!) 166/93 Pulse Rate: 80 Nausea and Vomiting: no nausea and no vomiting Tactile Disturbances: none Tremor: no tremor Auditory Disturbances: not present Paroxysmal Sweats: no sweat visible Visual Disturbances: mild sensitivity Anxiety: two Headache, Fullness in Head: none present Agitation: normal activity Orientation and Clouding of Sensorium: oriented and can do serial additions CIWA-Ar Total: 4 COWS:    Allergies: No Known Allergies  Home Medications: (Not in a hospital admission)   OB/GYN Status:  No LMP for male patient.  General Assessment Data [facility] Assessment: In system Is this a Tele or Face-to-Face Assessment?: Tele Assessment Is this an Initial Assessment or a Re-assessment for this encounter?: Initial Assessment Patient Accompanied by:: N/A Language Other than English: No What gender do you identify as?: Male Marital status: Divorced Living Arrangements: Alone Can pt return to current living arrangement?:  Yes Admission Status: Voluntary Is patient capable of signing voluntary admission?: Yes Referral Source: Self/Family/Friend Insurance type: Self Pay  Medical Screening Exam [facility] Walk-in ONLY) Medical Exam completed: Yes  Crisis Care Plan Living Arrangements: Alone Legal Guardian: Other:(Self) Name of Psychiatrist: none Name of Therapist: none  Education Status Is patient currently in school?: No  Risk to self with the  past 6 months Suicidal Ideation: Yes-Currently Present Has patient been a risk to self within the past 6 months prior to admission? : Yes Suicidal Intent: Yes-Currently Present Has patient had any suicidal intent within the past 6 months prior to admission? : Yes Is patient at risk for suicide?: Yes Suicidal Plan?: Yes-Currently Present Has patient had any suicidal plan within the past 6 months prior to admission? : Yes Specify Current Suicidal Plan: cut wrist, throad or overdose Access to Means: No What has been your use of drugs/alcohol within the last 12 months?: Alcohol and opiates Previous Attempts/Gestures: Yes How many times?: 1 Other Self Harm Risks: using alcohol and opiates together Triggers for Past Attempts: Other (Comment)(depression) Intentional Self Injurious Behavior: None Family Suicide History: No Recent stressful life event(s): Job Loss, Financial Problems(loss of full time employment and benefits) Persecutory voices/beliefs?: No Depression: Yes Depression Symptoms: Isolating, Loss of interest in usual pleasures, Feeling worthless/self pity, Despondent Substance abuse history and/or treatment for substance abuse?: Yes(alcohol and opiates) Suicide prevention information given to non-admitted patients: Not applicable  Risk to Others within the past 6 months Homicidal Ideation: No Does patient have any lifetime risk of violence toward others beyond the six months prior to admission? : Yes (comment) Thoughts of Harm to Others: No Current Homicidal Intent: No Current Homicidal Plan: No Access to Homicidal Means: No History of harm to others?: No Assessment of Violence: None Noted Does patient have access to weapons?: No Criminal Charges Pending?: No Does patient have a court date: No Is patient on probation?: No  Psychosis Hallucinations: Visual(seeing lights and spots) Delusions: None noted  Mental Status Report Appearance/Hygiene: Unremarkable Eye Contact:  Good Motor Activity: Unremarkable Speech: Logical/coherent Level of Consciousness: Alert Mood: Depressed, Anxious Affect: Anxious, Depressed Anxiety Level: Severe(10/10) Thought Processes: Relevant, Coherent Judgement: Impaired Orientation: Person, Place, Time Obsessive Compulsive Thoughts/Behaviors: None  Cognitive Functioning Concentration: Decreased Memory: Recent Intact, Remote Intact Is patient IDD: No Insight: Poor Impulse Control: Poor Appetite: Fair Have you had any weight changes? : No Change Sleep: No Change Total Hours of Sleep: 4 Vegetative Symptoms: None  ADLScreening [facility] Assessment Services) Patient's cognitive ability adequate to safely complete daily activities?: Yes Patient able to express need for assistance with ADLs?: No Independently performs ADLs?: Yes (appropriate for developmental age)  Prior Inpatient Therapy Prior Inpatient Therapy: Yes Prior Therapy Dates: 2020, 2019, 2017 (2x) Prior Therapy Facilty/Provider(s): [facility], rehab in [place] Reason for Treatment: substance abuse; depression  Prior Outpatient Therapy Prior Outpatient Therapy: Yes Prior Therapy Dates: 4 years ago Prior Therapy Facilty/Provider(s): Unknown Reason for Treatment: substance abuse; bipolar Does patient have an ACCT team?: No Does patient have Intensive In-House Services?  : No Does patient have [facility] services? : No Does patient have P4CC services?: No  ADL Screening (condition at time of admission) Patient's cognitive ability adequate to safely complete daily activities?: Yes Is the patient deaf or have difficulty hearing?: No Does the patient have difficulty seeing, even when wearing glasses/contacts?: No Does the patient have difficulty concentrating, remembering, or making decisions?: No Patient able to express need for assistance with ADLs?: No Does the  patient have difficulty dressing or bathing?: No Independently performs ADLs?: Yes (appropriate for  developmental age) Does the patient have difficulty walking or climbing stairs?: No Weakness of Legs: None Weakness of Arms/Hands: None  Home Assistive Devices/Equipment Home Assistive Devices/Equipment: None    Abuse/Neglect Assessment (Assessment to be complete while patient is alone) Physical Abuse: Denies Verbal Abuse: Denies Sexual Abuse: Denies Exploitation of patient/patient's resources: Denies Self-Neglect: Denies Values / Beliefs Cultural Requests During Hospitalization: None Spiritual Requests During Hospitalization: None   Advance Directives (For Healthcare) Does Patient Have a Medical Advance Directive?: No Would patient like information on creating a medical advance directive?: No - Patient declined          Disposition:  Disposition Initial Assessment Completed for this Encounter: Yes Disposition of Patient: Admit Type of inpatient treatment program: Adult  On Site Evaluation by:   Reviewed with Physician:    Dey-Johnson,Alanea Woolridge 03/15/2018 2:29 PM

## 2018-03-15 NOTE — ED Notes (Signed)
Sitter arrived to bedside.  

## 2018-03-15 NOTE — Progress Notes (Signed)
Patient was released from [facility] two days ago.  Reports drinking and using drugs but UDS negative.  Restarted medications and plan to discharge in the morning, patient is aware of the plan.  Nanine Means, PMHNP

## 2018-03-15 NOTE — ED Triage Notes (Signed)
Pt arrives to ED from home with complaints of suicidal thoughts for the last couple of days. Pt plans on cutting himself or overdosing.

## 2018-03-15 NOTE — ED Provider Notes (Signed)
[facility] EMERGENCY DEPARTMENT Provider Note   CSN: [id number] Arrival date & time: 03/15/18  1157     History   Chief Complaint Chief Complaint  Patient presents with  . Suicidal    HPI ARJAY HIBBERD is a 43 y.o. male with a hx of bipolar 1 disorder, depression, RA, Lupus, and seizures who presents to the ED with complaints of SI over the past few days. Patient states he is having thoughts of self harm- plans include overdose or cutting himself, no attempts have been made. No specific alleviating/aggravating factors. He is taking his medications as prescribed and states he feels they are not working. He admits to occasional visual hallucinations but does not further elaborate on this. Denies HI or auditory hallucinations. He denies drug use. Does admit to daily EtOH consumption, last drink yesterday, does have hx of withdrawal, unsure if he has been admitted for this in the past.   HPI  Past Medical History:  Diagnosis Date  . Bipolar 1 disorder (HCC)   . Depression   . Hypertension   . Lupus (HCC)   . Rheumatoid arthritis (HCC)   . Seizures (HCC)    per patient    Patient Active Problem List   Diagnosis Date Noted  . MDD (major depressive disorder), severe (HCC) 01/09/2018  . Adjustment disorder with mixed disturbance of emotions and conduct   . Substance induced mood disorder (HCC) 01/17/2016  . Severe episode of recurrent major depressive disorder, without psychotic features (HCC)   . Polysubstance dependence (HCC) 10/05/2015    No past surgical history on file.      Home Medications    Prior to Admission medications   Medication Sig Start Date End Date Taking? Authorizing Provider  carbamazepine (TEGRETOL) 100 MG chewable tablet Chew 1 tablet (100 mg total) by mouth 3 (three) times daily. For mood stabilization 02/12/18   Armandina Stammer I, NP  FLUoxetine (PROZAC) 40 MG capsule Take 1 capsule (40 mg total) by mouth daily. For depression 02/12/18    Armandina Stammer I, NP  gabapentin (NEURONTIN) 400 MG capsule Take 2 capsules (800 mg total) by mouth 3 (three) times daily. For agitation 02/12/18   Armandina Stammer I, NP  hydrOXYzine (ATARAX/VISTARIL) 25 MG tablet Take 1 tablet (25 mg total) by mouth 3 (three) times daily as needed for anxiety. 02/12/18   Armandina Stammer I, NP  metoprolol succinate (TOPROL-XL) 100 MG 24 hr tablet Take 1 tablet (100 mg total) by mouth daily. Take with or immediately following a meal: For high blood pressure 02/13/18   Nwoko, Nicole Kindred I, NP  nicotine (NICODERM CQ - DOSED IN MG/24 HOURS) 21 mg/24hr patch Place 1 patch (21 mg total) onto the skin daily. (May buy from over the counter): For smoking cessation 02/12/18   Armandina Stammer I, NP  QUEtiapine (SEROQUEL) 200 MG tablet Take 1 tablet (200 mg total) by mouth at bedtime. For mood control 02/12/18   Armandina Stammer I, NP  traZODone (DESYREL) 50 MG tablet Take 1 tablet (50 mg total) by mouth at bedtime as needed for sleep. 02/12/18   Sanjuana Kava, NP    Family History No family history on file.  Social History Social History   Tobacco Use  . Smoking status: Current Every Day Smoker    Packs/day: 1.50    Types: Cigarettes  . Smokeless tobacco: Never Used  Substance Use Topics  . Alcohol use: Yes    Alcohol/week: 6.0 - 8.0 standard drinks  Types: 6 - 8 Cans of beer per week    Comment: half gal liquor on the weekends  . Drug use: Yes    Comment: opiates/benzos     Allergies   Patient has no known allergies.   Review of Systems Review of Systems  Eyes: Negative for visual disturbance.  Respiratory: Negative for shortness of breath.   Cardiovascular: Negative for chest pain.  Neurological: Negative for dizziness, weakness, numbness and headaches.  Psychiatric/Behavioral: Positive for hallucinations and suicidal ideas. Negative for self-injury. The patient is nervous/anxious.   All other systems reviewed and are negative.    Physical Exam Updated Vital Signs There  were no vitals taken for this visit.  Physical Exam Vitals signs and nursing note reviewed.  Constitutional:      General: He is not in acute distress.    Appearance: He is well-developed. He is not toxic-appearing.  HENT:     Head: Normocephalic and atraumatic.  Eyes:     General:        Right eye: No discharge.        Left eye: No discharge.     Conjunctiva/sclera: Conjunctivae normal.  Neck:     Musculoskeletal: Neck supple.  Cardiovascular:     Rate and Rhythm: Normal rate and regular rhythm.  Pulmonary:     Effort: Pulmonary effort is normal. No respiratory distress.     Breath sounds: Normal breath sounds. No wheezing, rhonchi or rales.  Abdominal:     General: There is no distension.     Palpations: Abdomen is soft.     Tenderness: There is no abdominal tenderness.  Skin:    General: Skin is warm and dry.     Findings: No rash.  Neurological:     General: No focal deficit present.     Mental Status: He is alert.     Comments: Clear speech.   Psychiatric:        Attention and Perception: Attention normal.        Thought Content: Thought content includes suicidal ideation. Thought content does not include homicidal ideation. Thought content includes suicidal plan. Thought content does not include homicidal plan.      ED Treatments / Results  Labs (all labs ordered are listed, but only abnormal results are displayed) Labs Reviewed  CBC - Abnormal; Notable for the following components:      Result Value   Platelets 131 (*)    All other components within normal limits  COMPREHENSIVE METABOLIC PANEL - Abnormal; Notable for the following components:   Glucose, Bld 165 (*)    AST 50 (*)    ALT 99 (*)    All other components within normal limits  RAPID URINE DRUG SCREEN, HOSP PERFORMED  ETHANOL    EKG None  Radiology No results found.  Procedures Procedures (including critical care time)  Medications Ordered in ED Medications - No data to  display   Initial Impression / Assessment and Plan / ED Course  I have reviewed the triage vital signs and the nursing notes.  Pertinent labs & imaging results that were available during my care of the patient were reviewed by me and considered in my medical decision making (see chart for details).   Patient presents to the ED with SI. Nontoxic appearing, BP elevated, vitals otherwise WNL, doubt HTN emergency. Screening labs notable for elevated LFTs similar to prior, no abdominal tenderness. Hyperglycemia at 165 will require outpatient follow up. Placed on CIWA protocol secondary to  hx of alcohol abuse, initial CIWA 4, Ethanol level WNL.   Patient medically cleared for TTS evaluation. Disposition per [facility]. Holding orders/ home meds placed.    Final Clinical Impressions(s) / ED Diagnoses   Final diagnoses:  Suicidal ideation  Hyperglycemia  Elevated LFTs    ED Discharge Orders    None       Cherly Anderson, PA-C 03/15/18 1327    Linwood Dibbles, MD 03/16/18 [phone]

## 2018-03-15 NOTE — ED Notes (Signed)
Pt given lunch tray.

## 2018-03-15 NOTE — ED Notes (Addendum)
Pt arrived to Rm [facility] - ambulatory - wearing burgundy scrubs. Pt's belongings 1 labeled belongings bag and labeled back pack placed at nurses' desk for inventory. Pt stated he was at [facility] recently and feels needs additional Inpt Tx d/t feeling more SI. States he is taking the meds but feels they are not working. Reports drinking ETOH - 12-pack daily. No withdrawal symptoms noted at this time - except c/o anxiety. Pt verbalized understanding of Medial Clearance Pt Policy while in ED - copy given.

## 2018-03-15 NOTE — ED Notes (Signed)
ALL belongings inventoried by Vladimir Faster, NT - 2 Labeled belongings bags placed in [place] #3 and Valuables Envelope w/Security.

## 2018-03-16 ENCOUNTER — Encounter (HOSPITAL_COMMUNITY): Payer: Self-pay | Admitting: Registered Nurse

## 2018-03-16 NOTE — [facility]HH Counselor (Signed)
Attempted to contact Pt's significant other for collateral -- Juanda Chance [phone].  There was no answer, and the mailbox was full.  Could not leave message.

## 2018-03-16 NOTE — ED Notes (Signed)
ALL belongings - 2 bags and 1 valuables envelope - returned to pt - Pt signed verifying all items present.

## 2018-03-16 NOTE — Consult Note (Signed)
  Tele Assessment   Kenneth Elliott, 43 y.o., male patient presented to [facility] via EMS with complaints of suicidal ideation.  Patient seen via telepsych by this provider; chart reviewed and consulted with Dr. Lucianne Muss on 03/16/18.  Patient discharged from [facility] 3 days ago.  Patient psychiatrically assessed by Kenneth Means, NP 03/15/18; his medication was retarded and plan to discharged this AM.   Patient will be follow up by Kenneth Means, NP today.      Shuvon B. Rankin, NP

## 2018-03-16 NOTE — Progress Notes (Signed)
Patient was scheduled to discharge this morning.  Stable per nurse and resources in place. He left [facility] two days prior to coming to the ED and prior to that he was at [facility].  His girlfriend was unable to reach but the patient said she usually did not answer her phone.  Stable for discharge.  Nanine Means, PMHNP

## 2018-03-16 NOTE — ED Provider Notes (Signed)
Chart and vitals reviewed.  Initially, recommended patient be inpatient.  However, most recent behavioral health team note indicates patient will be restarted on his medications and discharge in the morning.  Will continue to follow psych recommendations.  Per [facility] team, pt is safe for d/c.     Alveria Apley, PA-C 03/16/18 1130    Gwyneth Sprout, MD 03/16/18 1520

## 2018-03-16 NOTE — ED Notes (Signed)
Breakfast tray ordered 

## 2018-04-26 ENCOUNTER — Emergency Department (HOSPITAL_COMMUNITY)
Admission: EM | Admit: 2018-04-26 | Discharge: 2018-04-27 | Disposition: A | Discharge status: Home / Self Care | Payer: Self-pay | Attending: Emergency Medicine | Admitting: Emergency Medicine

## 2018-04-26 ENCOUNTER — Other Ambulatory Visit: Payer: Self-pay

## 2018-04-26 ENCOUNTER — Encounter (HOSPITAL_COMMUNITY): Payer: Self-pay | Admitting: *Deleted

## 2018-04-26 DIAGNOSIS — W25XXXA Contact with sharp glass, initial encounter: Secondary | ICD-10-CM | POA: Insufficient documentation

## 2018-04-26 DIAGNOSIS — F1910 Other psychoactive substance abuse, uncomplicated: Secondary | ICD-10-CM | POA: Insufficient documentation

## 2018-04-26 DIAGNOSIS — I10 Essential (primary) hypertension: Secondary | ICD-10-CM | POA: Insufficient documentation

## 2018-04-26 DIAGNOSIS — Y929 Unspecified place or not applicable: Secondary | ICD-10-CM | POA: Insufficient documentation

## 2018-04-26 DIAGNOSIS — Y999 Unspecified external cause status: Secondary | ICD-10-CM | POA: Insufficient documentation

## 2018-04-26 DIAGNOSIS — Y9389 Activity, other specified: Secondary | ICD-10-CM | POA: Insufficient documentation

## 2018-04-26 DIAGNOSIS — Z915 Personal history of self-harm: Secondary | ICD-10-CM | POA: Insufficient documentation

## 2018-04-26 DIAGNOSIS — F15951 Other stimulant use, unspecified with stimulant-induced psychotic disorder with hallucinations: Secondary | ICD-10-CM | POA: Insufficient documentation

## 2018-04-26 DIAGNOSIS — F332 Major depressive disorder, recurrent severe without psychotic features: Secondary | ICD-10-CM | POA: Insufficient documentation

## 2018-04-26 DIAGNOSIS — Z79899 Other long term (current) drug therapy: Secondary | ICD-10-CM | POA: Insufficient documentation

## 2018-04-26 DIAGNOSIS — F15959 Other stimulant use, unspecified with stimulant-induced psychotic disorder, unspecified: Secondary | ICD-10-CM

## 2018-04-26 DIAGNOSIS — F17210 Nicotine dependence, cigarettes, uncomplicated: Secondary | ICD-10-CM | POA: Insufficient documentation

## 2018-04-26 DIAGNOSIS — S60512A Abrasion of left hand, initial encounter: Secondary | ICD-10-CM | POA: Insufficient documentation

## 2018-04-26 LAB — CBC
HEMATOCRIT: 42.4 % (ref 39.0–52.0)
Hemoglobin: 14.3 g/dL (ref 13.0–17.0)
MCH: 30.8 pg (ref 26.0–34.0)
MCHC: 33.7 g/dL (ref 30.0–36.0)
MCV: 91.4 fL (ref 80.0–100.0)
Platelets: 212 10*3/uL (ref 150–400)
RBC: 4.64 MIL/uL (ref 4.22–5.81)
RDW: 13.8 % (ref 11.5–15.5)
WBC: 11.8 10*3/uL — AB (ref 4.0–10.5)
nRBC: 0 % (ref 0.0–0.2)

## 2018-04-26 LAB — COMPREHENSIVE METABOLIC PANEL
ALBUMIN: 5.1 g/dL — AB (ref 3.5–5.0)
ALT: 154 U/L — ABNORMAL HIGH (ref 0–44)
AST: 121 U/L — ABNORMAL HIGH (ref 15–41)
Alkaline Phosphatase: 88 U/L (ref 38–126)
Anion gap: 15 (ref 5–15)
BUN: 24 mg/dL — ABNORMAL HIGH (ref 6–20)
CHLORIDE: 100 mmol/L (ref 98–111)
CO2: 21 mmol/L — ABNORMAL LOW (ref 22–32)
Calcium: 9.5 mg/dL (ref 8.9–10.3)
Creatinine, Ser: 1.44 mg/dL — ABNORMAL HIGH (ref 0.61–1.24)
GFR calc Af Amer: >60 mL/min (ref >60)
GFR calc non Af Amer: 59 mL/min — ABNORMAL LOW (ref >60)
Glucose, Bld: 102 mg/dL — ABNORMAL HIGH (ref 70–99)
Potassium: 5.3 mmol/L — ABNORMAL HIGH (ref 3.5–5.1)
Sodium: 136 mmol/L (ref 135–145)
Total Bilirubin: 1.2 mg/dL (ref 0.3–1.2)
Total Protein: 8.6 g/dL — ABNORMAL HIGH (ref 6.5–8.1)

## 2018-04-26 LAB — RAPID URINE DRUG SCREEN, HOSP PERFORMED
Amphetamines: POSITIVE — AB
Barbiturates: NOT DETECTED
Benzodiazepines: NOT DETECTED
Cocaine: NOT DETECTED
Opiates: NOT DETECTED
TETRAHYDROCANNABINOL: NOT DETECTED

## 2018-04-26 LAB — CARBAMAZEPINE LEVEL, TOTAL: Carbamazepine Lvl: <2 ug/mL — ABNORMAL LOW (ref 4.0–12.0)

## 2018-04-26 LAB — ETHANOL: Alcohol, Ethyl (B): <10 mg/dL (ref <10)

## 2018-04-26 MED ORDER — QUETIAPINE FUMARATE 100 MG PO TABS
200.0000 mg | ORAL_TABLET | Freq: Every day | ORAL | Status: DC
  Administered 2018-04-26: 200 mg via ORAL
  Filled 2018-04-26: qty 2

## 2018-04-26 MED ORDER — FLUOXETINE HCL 20 MG PO CAPS
40.0000 mg | ORAL_CAPSULE | Freq: Every day | ORAL | Status: DC
  Administered 2018-04-26 – 2018-04-27 (×2): 40 mg via ORAL
  Filled 2018-04-26 (×2): qty 2

## 2018-04-26 MED ORDER — CARBAMAZEPINE 100 MG PO CHEW
100.0000 mg | CHEWABLE_TABLET | Freq: Three times a day (TID) | ORAL | Status: DC
  Administered 2018-04-26 – 2018-04-27 (×4): 100 mg via ORAL
  Filled 2018-04-26 (×6): qty 1

## 2018-04-26 MED ORDER — HALOPERIDOL LACTATE 5 MG/ML IJ SOLN
2.5000 mg | Freq: Once | INTRAMUSCULAR | Status: AC
  Administered 2018-04-26: 2.5 mg via INTRAVENOUS
  Filled 2018-04-26: qty 1

## 2018-04-26 MED ORDER — TRAZODONE HCL 50 MG PO TABS: 50.0000 mg | ORAL_TABLET | Freq: Every evening | ORAL | Status: DC | PRN

## 2018-04-26 MED ORDER — LACTATED RINGERS IV BOLUS
1000.0000 mL | Freq: Once | INTRAVENOUS | Status: AC
  Administered 2018-04-26: 1000 mL via INTRAVENOUS

## 2018-04-26 MED ORDER — METOPROLOL SUCCINATE ER 100 MG PO TB24
100.0000 mg | ORAL_TABLET | Freq: Every day | ORAL | Status: DC
  Administered 2018-04-26 – 2018-04-27 (×2): 100 mg via ORAL
  Filled 2018-04-26 (×2): qty 1

## 2018-04-26 MED ORDER — LORAZEPAM 2 MG/ML IJ SOLN
1.0000 mg | Freq: Once | INTRAMUSCULAR | Status: AC
  Administered 2018-04-26: 1 mg via INTRAVENOUS
  Filled 2018-04-26: qty 1

## 2018-04-26 NOTE — BH Assessment (Signed)
[name] Assessment Progress Note Second attempt to assess at 1700 hours unsuccessful. Patient continues to sleep and could not be roused. Patient was impaired on arrival and medicated earlier this date due to agitation.

## 2018-04-26 NOTE — ED Triage Notes (Signed)
EMS states [employer] was called to residence due to pt breaking window with fire extinguisher. He has taken multiple amphetamines and gabapentin in the last 23 hours. Pt knows name and is hyperventilating upon arrival.

## 2018-04-26 NOTE — ED Notes (Signed)
1 bag personal belongings, underwear, shorts, socks and tennis shoes. Pt changed and wanded

## 2018-04-26 NOTE — ED Notes (Signed)
PT NOW STATES HE DID THIS TO HURT SELF DUE TO "SHIT MY GIRLFRIEND AND I ARE GOING THROUGH"

## 2018-04-26 NOTE — ED Notes (Signed)
Pt states he may need to go over to behavioral health

## 2018-04-26 NOTE — BH Assessment (Signed)
[name] Assessment Progress Note 0830 Patient cannot be assessed due to current substance induced altered mental state .

## 2018-04-26 NOTE — BH Assessment (Signed)
Assessment Note  Kenneth FinnerBrandon E Elliott is an 43 y.o. male who presented to the ED with hallucinations and erratic behavior due to his methamphetamine use.  When asked if he has hallucinations when he is not using, patient states, "no."  Patient states that he had suicidal ideation yesterday, but could not identify a plan.  Patient states, "I just want it to be over with."  Patient states that he has attempted suicide on two occasions in the past by overdose and [facility].  Patient has not been compliant with his discharge recommendations and he has been having his medications filled by his PCP.  Patient denies any HI.  Patient states that he has not been sleeping more than five hours per night and states that he has not been eating much and has lost weight, but he is not sure how much. When asked if he has access to weapons, patient stated that he had knives in his home.  Patient states that he has been using methamphetamine, 1-2 grams a couple times a week and states that he last used last pm.  He states that he has been drinking 2-4 times weekly at least six beers at a time and his last use was yesterday.  He states that he has been in [facility] [place] in [place] and he was clean and sober for almost four years on one occasion.    Patient states that he currently resides alone.  He states that he has a 45nineteen year old daughter.  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  TTS attempted to contact his significant other, Juanda ChanceLaura Robards [phone], for collateral information, but no one answered the phone and there was not a voicemail set up on the phone.   Patient presented as a little drowsy, but fully oriented.  He did not appear to be responding to any internal stimuli. His mood was depressed and his affect flat.  His memory appeared to be intact and his thoughts were  organized.  His eye contact was fair, but his speech was clear and coherent.  His judgment, insight and impulse control appeared to be impaired.  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 Bipolar 1 disorder (HCC)   . Depression   . Hypertension   . Lupus (HCC)   . Rheumatoid arthritis (HCC)   . Seizures (HCC)    per patient    History reviewed. No pertinent surgical history.  Family History: No family history on file.  Social History:  reports that he has been smoking cigarettes. He has been smoking about 1.50 packs per day. He has never used smokeless tobacco. He reports current alcohol use of about 6.0 - 8.0 standard drinks of alcohol per week. He reports current drug use.  Additional Social History:  Alcohol / Drug Use Pain Medications: Neurontin Prescriptions: Prozac, Tegretol, Gabapentin, Unknown HTN Medication Over the Counter: denies History of alcohol / drug use?: Yes Longest period of sobriety (when/how long): "almost 4 years" Negative Consequences of Use: Financial, Personal relationships, Work / School Substance #1 Name of Substance 1: Methamphetamine 1 - Amount (size/oz): 1-2 grams 1 - Frequency: 2-3 times weekly 1 - Last Use / Amount: last use was last pm Substance #2 Name of Substance 2: Alcohol 2 - Amount (  size/oz): 6 beers 2 - Frequency: 3-4 times a week 2 - Duration: on-going  2 - Last Use / Amount: last use was last pm  CIWA: CIWA-Ar BP: 110/80 Pulse Rate: 84 COWS:    Allergies: No Known Allergies  Home Medications: (Not in a hospital admission)   OB/GYN Status:  No LMP for male patient.  General Assessment Data Assessment unable to be completed: Yes Reason for not completing assessment: Pt is impaired Location of Assessment: [facility] ED TTS Assessment: In system Is this a Tele or Face-to-Face Assessment?: Face-to-Face Is this  an Initial Assessment or a Re-assessment for this encounter?: Initial Assessment Patient Accompanied by:: N/A Language Other than English: No Living Arrangements: Other (Comment)(states that he has his own place) What gender do you identify as?: Male Marital status: Divorced Living Arrangements: Alone Can pt return to current living arrangement?: Yes Admission Status: Voluntary Is patient capable of signing voluntary admission?: Yes Referral Source: Self/Family/Friend Insurance type: (self-pay)     Crisis Care Plan Living Arrangements: Alone Legal Guardian: Other:(self) Name of Psychiatrist: (none) Name of Therapist: none  Education Status Is patient currently in school?: No Is the patient employed, unemployed or receiving disability?: Employed  Risk to self with the past 6 months Suicidal Ideation: Yes-Currently Present Has patient been a risk to self within the past 6 months prior to admission? : Yes Suicidal Intent: No Has patient had any suicidal intent within the past 6 months prior to admission? : Yes Is patient at risk for suicide?: Yes Suicidal Plan?: No Has patient had any suicidal plan within the past 6 months prior to admission? : Yes Specify Current Suicidal Plan: (no plan currently) Access to Means: No What has been your use of drugs/alcohol within the last 12 months?: (uses drugs and alcohol 3-4 times weekly) Previous Attempts/Gestures: Yes How many times?: 2 Other Self Harm Risks: (alcohol and drug problem) Triggers for Past Attempts: None known Family Suicide History: No Recent stressful life event(s): Financial Problems(drug and alcohol problem) Persecutory voices/beliefs?: No Depression: Yes Depression Symptoms: Despondent, Insomnia, Isolating, Guilt, Loss of interest in usual pleasures, Feeling worthless/self pity Substance abuse history and/or treatment for substance abuse?: Yes Suicide prevention information given to non-admitted patients: Not  applicable  Risk to Others within the past 6 months Homicidal Ideation: No Does patient have any lifetime risk of violence toward others beyond the six months prior to admission? : No Thoughts of Harm to Others: No Current Homicidal Intent: No Current Homicidal Plan: No Access to Homicidal Means: No Identified Victim: none History of harm to others?: No Assessment of Violence: None Noted Violent Behavior Description: none Does patient have access to weapons?: Yes (Comment) Criminal Charges Pending?: (knives in home) Does patient have a court date: No Is patient on probation?: No  Psychosis Hallucinations: Auditory, Visual Delusions: None noted  Mental Status Report Appearance/Hygiene: Disheveled Eye Contact: Good Motor Activity: Freedom of movement Speech: Logical/coherent Level of Consciousness: Drowsy Mood: Depressed, Anxious Affect: Anxious, Depressed Anxiety Level: Severe Thought Processes: Coherent, Relevant Judgement: Impaired Orientation: Person, Place, Time, Situation Obsessive Compulsive Thoughts/Behaviors: Severe(with drug use)  Cognitive Functioning Concentration: Decreased Memory: Recent Intact, Remote Intact Is patient IDD: No Insight: Poor Impulse Control: Poor Appetite: Poor Have you had any weight changes? : Loss Amount of the weight change? (lbs): (unsure how much) Sleep: Decreased Total Hours of Sleep: 5 Vegetative Symptoms: Decreased grooming  ADLScreening [name] Assessment Services) Patient's cognitive ability adequate to safely complete daily activities?: Yes Patient able to express need  for assistance with ADLs?: Yes Independently performs ADLs?: Yes (appropriate for developmental age)  Prior Inpatient Therapy Prior Inpatient Therapy: Yes Prior Therapy Dates: 2019/2020 Prior Therapy Facilty/Provider(s): [facility] Reason for Treatment: psychosis, suicidal. subtsance use  Prior Outpatient Therapy Prior Outpatient Therapy: Yes Prior Therapy  Dates: 4 years ago Prior Therapy Facilty/Provider(s): unknown Reason for Treatment: substance use and depression Does patient have an ACCT team?: No Does patient have Intensive In-House Services?  : No Does patient have [facility] services? : No Does patient have P4CC services?: No  ADL Screening (condition at time of admission) Patient's cognitive ability adequate to safely complete daily activities?: Yes Is the patient deaf or have difficulty hearing?: No Does the patient have difficulty seeing, even when wearing glasses/contacts?: No Does the patient have difficulty concentrating, remembering, or making decisions?: No Patient able to express need for assistance with ADLs?: Yes Does the patient have difficulty dressing or bathing?: No Independently performs ADLs?: Yes (appropriate for developmental age) Does the patient have difficulty walking or climbing stairs?: No Weakness of Legs: None Weakness of Arms/Hands: None  Home Assistive Devices/Equipment Home Assistive Devices/Equipment: None  Therapy Consults (therapy consults require a physician order) PT Evaluation Needed: No OT Evalulation Needed: No SLP Evaluation Needed: No Abuse/Neglect Assessment (Assessment to be complete while patient is alone) Abuse/Neglect Assessment Can Be Completed: Yes Physical Abuse: Denies Verbal Abuse: Denies Sexual Abuse: Denies Exploitation of patient/patient's resources: Denies Self-Neglect: Denies Values / Beliefs Cultural Requests During Hospitalization: None Spiritual Requests During Hospitalization: None Consults Spiritual Care Consult Needed: No Social Work Consult Needed: No Merchant navy officer (For Healthcare) Does Patient Have a Medical Advance Directive?: No Would patient like information on creating a medical advance directive?: No - Patient declined Nutrition Screen- [facility] Has the patient recently lost weight without trying?: Yes, 2-13 lbs. Has the patient been eating  poorly because of a decreased appetite?: Yes Malnutrition Screening Tool Score: 2        Disposition: Per Nira Conn, NP,  due to patient having two hospitalizations since December for the same as well as his continued use of drugs and non-compliance with treatment recommendations, patient will need to be monitored for safety and withdrawal potential overnight and reassessed by psychiatry tomorrow morning.  Disposition Initial Assessment Completed for this Encounter: Yes Disposition of Patient: (Overnight OBS)  On Site Evaluation by:   Reviewed with Physician:    Arnoldo Lenis Shyam Dawson 04/26/2018 9:57 PM

## 2018-04-26 NOTE — ED Provider Notes (Signed)
[facility]-EMERGENCY DEPT Provider Note   CSN: [id number] Arrival date & time: 04/26/18  0736    History   Chief Complaint Chief Complaint  Patient presents with  . Ingestion    HPI Kenneth Elliott is a 43 y.o. male.    Level 5 caveat due to psychiatric disorder. HPI Patient presents brought in after hallucinations.  Reportedly is been using amphetamines and Neurontin.  Patient cannot really tell me what he has been doing.  States he thinks he has been hallucinating.  Reportedly was breaking windows.  Patient states he is suicidal also.  Brought in by police and EMS. Past Medical History:  Diagnosis Date  . Bipolar 1 disorder (HCC)   . Depression   . Hypertension   . Lupus (HCC)   . Rheumatoid arthritis (HCC)   . Seizures (HCC)    per patient    Patient Active Problem List   Diagnosis Date Noted  . Adjustment disorder with mixed disturbance of emotions and conduct   . Substance induced mood disorder (HCC) 01/17/2016  . Polysubstance dependence (HCC) 10/05/2015    History reviewed. No pertinent surgical history.      Home Medications    Prior to Admission medications   Medication Sig Start Date End Date Taking? Authorizing Provider  carbamazepine (TEGRETOL) 100 MG chewable tablet Chew 1 tablet (100 mg total) by mouth 3 (three) times daily. For mood stabilization 02/12/18  Yes Nwoko, Nicole Kindred I, NP  FLUoxetine (PROZAC) 40 MG capsule Take 1 capsule (40 mg total) by mouth daily. For depression 02/12/18  Yes Armandina Stammer I, NP  metoprolol succinate (TOPROL-XL) 100 MG 24 hr tablet Take 1 tablet (100 mg total) by mouth daily. Take with or immediately following a meal: For high blood pressure 02/13/18  Yes Nwoko, Agnes I, NP  QUEtiapine (SEROQUEL) 200 MG tablet Take 1 tablet (200 mg total) by mouth at bedtime. For mood control Patient taking differently: Take 200 mg by mouth at bedtime as needed (mood control). For mood control 02/12/18  Yes Nwoko, Nicole Kindred I, NP   gabapentin (NEURONTIN) 400 MG capsule Take 2 capsules (800 mg total) by mouth 3 (three) times daily. For agitation 02/12/18   Armandina Stammer I, NP  hydrOXYzine (ATARAX/VISTARIL) 25 MG tablet Take 1 tablet (25 mg total) by mouth 3 (three) times daily as needed for anxiety. Patient not taking: Reported on 04/26/2018 02/12/18   Armandina Stammer I, NP  nicotine (NICODERM CQ - DOSED IN MG/24 HOURS) 21 mg/24hr patch Place 1 patch (21 mg total) onto the skin daily. (May buy from over the counter): For smoking cessation Patient not taking: Reported on 03/15/2018 02/12/18   Armandina Stammer I, NP  traZODone (DESYREL) 50 MG tablet Take 1 tablet (50 mg total) by mouth at bedtime as needed for sleep. 02/12/18   Sanjuana Kava, NP    Family History No family history on file.  Social History Social History   Tobacco Use  . Smoking status: Current Every Day Smoker    Packs/day: 1.50    Types: Cigarettes  . Smokeless tobacco: Never Used  Substance Use Topics  . Alcohol use: Yes    Alcohol/week: 6.0 - 8.0 standard drinks    Types: 6 - 8 Cans of beer per week    Comment: half gal liquor on the weekends  . Drug use: Yes    Comment: opiates/benzos     Allergies   Patient has no known allergies.   Review of Systems Review  of Systems  Unable to perform ROS: Psychiatric disorder     Physical Exam Updated Vital Signs BP (!) 143/90 (BP Location: Left Arm)   Pulse (!) 108   Temp 98.3 F (36.8 C) (Oral)   Resp (!) 28   SpO2 95%   Physical Exam Vitals signs and nursing note reviewed.  HENT:     Head: Normocephalic.     Mouth/Throat:     Mouth: Mucous membranes are moist.  Eyes:     Pupils: Pupils are equal, round, and reactive to light.  Cardiovascular:     Rate and Rhythm: Normal rate.     Heart sounds: Normal heart sounds.     Comments: tachycardia Pulmonary:     Breath sounds: Normal breath sounds.  Abdominal:     Tenderness: There is no abdominal tenderness.  Musculoskeletal:     Comments: Some  abrasions on left knuckles.  Skin:    General: Skin is warm.     Capillary Refill: Capillary refill takes less than 2 seconds.  Neurological:     Mental Status: He is alert.     Comments: Patient really cannot tell me what happened.  Still somewhat agitated but is appropriate.      ED Treatments / Results  Labs (all labs ordered are listed, but only abnormal results are displayed) Labs Reviewed  COMPREHENSIVE METABOLIC PANEL - Abnormal; Notable for the following components:      Result Value   Potassium 5.3 (*)    CO2 21 (*)    Glucose, Bld 102 (*)    BUN 24 (*)    Creatinine, Ser 1.44 (*)    Total Protein 8.6 (*)    Albumin 5.1 (*)    AST 121 (*)    ALT 154 (*)    GFR calc non Af Amer 59 (*)    All other components within normal limits  CBC - Abnormal; Notable for the following components:   WBC 11.8 (*)    All other components within normal limits  RAPID URINE DRUG SCREEN, HOSP PERFORMED - Abnormal; Notable for the following components:   Amphetamines POSITIVE (*)    All other components within normal limits  CARBAMAZEPINE LEVEL, TOTAL - Abnormal; Notable for the following components:   Carbamazepine Lvl <2.0 (*)    All other components within normal limits  ETHANOL    EKG EKG Interpretation  Date/Time:  Saturday April 26 2018 07:55:00 EDT Ventricular Rate:  113 PR Interval:    QRS Duration: 94 QT Interval:  323 QTC Calculation: 443 R Axis:   54 Text Interpretation:  Sinus tachycardia Probable left atrial enlargement Confirmed by Benjiman Core [phone]) on 04/26/2018 8:08:28 AM   Radiology No results found.  Procedures Procedures (including critical care time)  Medications Ordered in ED Medications  lactated ringers bolus 1,000 mL (0 mLs Intravenous Stopped 04/26/18 1019)  LORazepam (ATIVAN) injection 1 mg (1 mg Intravenous Given 04/26/18 0913)  haloperidol lactate (HALDOL) injection 2.5 mg (2.5 mg Intravenous Given 04/26/18 0932)     Initial  Impression / Assessment and Plan / ED Course  I have reviewed the triage vital signs and the nursing notes.  Pertinent labs & imaging results that were available during my care of the patient were reviewed by me and considered in my medical decision making (see chart for details).        Patient presents with polysubstance abuse.  Agitated.  Required Ativan and Haldol to help calm down.  Lab work overall reassuring.  Creatinine mildly increased potassium just barely above normal.  Fluid given I think this will likely be enough.  Medically cleared to be seen by TTS.  Patient was altered when he made the statements reportedly had some hallucinations and potential suicidal thoughts.  Final Clinical Impressions(s) / ED Diagnoses   Final diagnoses:  Polysubstance abuse [facility](HCC)    ED Discharge Orders    None       Benjiman CorePickering, Glenola Wheat, MD 04/26/18 1030

## 2018-04-26 NOTE — ED Notes (Signed)
Pt remains agitated continues to thrash about in bed. Dr Rubin Payor aware, come to evaluate pt. Haldol ordered and given.

## 2018-04-26 NOTE — ED Notes (Signed)
Pt A&O x 3, no distress noted, calm & cooperative, resting at present.  Monitoring for safety, Q 15 min checks in effect. 

## 2018-04-26 NOTE — ED Notes (Signed)
Pt now resting without s/s of distress.

## 2018-04-26 NOTE — ED Notes (Signed)
Pt agitated and thrashing about in bed, diaphoretic and thirsty. Pt will not calm or follow commands. Dr Rubin Payor aware and ordered Ativan

## 2018-04-26 NOTE — ED Notes (Signed)
Pt now calm and sleeping.

## 2018-04-26 NOTE — BH Assessment (Signed)
[name] Assessment Progress Note    Per Nira Conn, NP,  due to patient having two hospitalizations since December for the same as well as his continued use of drugs and non-compliance with treatment recommendations, patient will need to be monitored for safety and withdrawal potential overnight and reassessed by psychiatry tomorrow morning.

## 2018-04-27 DIAGNOSIS — F15959 Other stimulant use, unspecified with stimulant-induced psychotic disorder, unspecified: Secondary | ICD-10-CM | POA: Diagnosis present

## 2018-04-27 MED ORDER — CARBAMAZEPINE 100 MG PO CHEW: 200.0000 mg | CHEWABLE_TABLET | Freq: Two times a day (BID) | ORAL | Status: DC

## 2018-04-27 NOTE — Consult Note (Signed)
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 He states that he has been using about 1-2 grams of Methamphetamine at least 3 times a week and drinks at least 6 beers 2-4 times a week. Today, patient is calm, cooperative, denies psychosis, delusions and SI/HI.   Total Time spent with patient: 30 minutes  Past Psychiatric History: as above  Past Medical History:  Past Medical History:  Diagnosis Date  . Bipolar 1 disorder (HCC)   . Depression   . Hypertension   . Lupus (HCC)   . Rheumatoid arthritis (HCC)   . Seizures (HCC)    per patient   History reviewed. No pertinent surgical history. Family History: No family history on file. Family Psychiatric  History:  Social History:  Social History   Substance and Sexual Activity  Alcohol Use Yes  . Alcohol/week: 6.0 - 8.0 standard drinks  . Types: 6 - 8 Cans of beer per week   Comment: half gal liquor on the weekends     Social History   Substance and Sexual Activity  Drug Use Yes   Comment: opiates/benzos    Social History   Socioeconomic History  . Marital status: Divorced    Spouse name: Not on file  . Number of children: Not on file  . Years of education: Not on file  . Highest education level: Not on file  Occupational History  . Not on file  Social Needs  . Financial resource strain: Not on file  . Food insecurity:    Worry: Not on file    Inability: Not on file  . Transportation needs:    Medical: Not on file    Non-medical: Not on file  Tobacco Use  . Smoking status: Current Every Day Smoker    Packs/day: 1.50    Types: Cigarettes   . Smokeless tobacco: Never Used  Substance and Sexual Activity  . Alcohol use: Yes    Alcohol/week: 6.0 - 8.0 standard drinks    Types: 6 - 8 Cans of beer per week    Comment: half gal liquor on the weekends  . Drug use: Yes    Comment: opiates/benzos  . Sexual activity: Yes    Partners: Female    Birth control/protection: None  Lifestyle  . Physical activity:    Days per week: Not on file    Minutes per session: Not on file  . Stress: Not on file  Relationships  . Social connections:    Talks on phone: Not on file    Gets together: Not on file    Attends religious service: Not on file    Active member of club or organization: Not on file    Attends meetings of clubs or organizations: Not on file    Relationship status: Not on file  Other Topics Concern  . Not on file  Social History Narrative  . Not on file    Has this patient used any form of tobacco in the last 30 days? (Cigarettes, Smokeless Tobacco, Cigars, and/or Pipes): Patient denies using Cigarette  Current Medications: Current Facility-Administered Medications  Medication Dose Route Frequency Provider Last Rate Last Dose  . carbamazepine (TEGRETOL) chewable tablet 200  mg  200 mg Oral BID Aysiah Jurado, MD      . FLUoxetine (PROZAC) capsule 40 mg  40 mg Oral Daily Benjiman Core, MD   40 mg at 04/27/18 0913  . metoprolol succinate (TOPROL-XL) 24 hr tablet 100 mg  100 mg Oral Daily Benjiman Core, MD   100 mg at 04/27/18 0912  . QUEtiapine (SEROQUEL) tablet 200 mg  200 mg Oral Renee Harder, MD   200 mg at 04/26/18 2211  . traZODone (DESYREL) tablet 50 mg  50 mg Oral QHS PRN Benjiman Core, MD       Current Outpatient Medications  Medication Sig Dispense Refill  . carbamazepine (TEGRETOL) 100 MG chewable tablet Chew 1 tablet (100 mg total) by mouth 3 (three) times daily. For mood stabilization 90 tablet 0  . FLUoxetine (PROZAC) 40 MG capsule Take 1 capsule (40 mg total) by mouth daily. For  depression 30 capsule 0  . metoprolol succinate (TOPROL-XL) 100 MG 24 hr tablet Take 1 tablet (100 mg total) by mouth daily. Take with or immediately following a meal: For high blood pressure 30 tablet 0  . QUEtiapine (SEROQUEL) 200 MG tablet Take 1 tablet (200 mg total) by mouth at bedtime. For mood control (Patient taking differently: Take 200 mg by mouth at bedtime as needed (mood control). For mood control) 30 tablet 0  . gabapentin (NEURONTIN) 400 MG capsule Take 2 capsules (800 mg total) by mouth 3 (three) times daily. For agitation 180 capsule 0  . hydrOXYzine (ATARAX/VISTARIL) 25 MG tablet Take 1 tablet (25 mg total) by mouth 3 (three) times daily as needed for anxiety. (Patient not taking: Reported on 04/26/2018) 60 tablet 0  . nicotine (NICODERM CQ - DOSED IN MG/24 HOURS) 21 mg/24hr patch Place 1 patch (21 mg total) onto the skin daily. (May buy from over the counter): For smoking cessation (Patient not taking: Reported on 03/15/2018) 1 patch 0  . traZODone (DESYREL) 50 MG tablet Take 1 tablet (50 mg total) by mouth at bedtime as needed for sleep. 30 tablet 0   PTA Medications: (Not in a hospital admission)   Musculoskeletal: Strength & Muscle Tone: not tested Gait & Station: normal Patient leans: N/A  Psychiatric Specialty Exam: Physical Exam  Psychiatric: He has a normal mood and affect. His speech is normal and behavior is normal. Judgment and thought content normal. Cognition and memory are normal.    Review of Systems  Constitutional: Negative.   HENT: Negative.   Eyes: Negative.   Respiratory: Negative.   Cardiovascular: Negative.   Gastrointestinal: Negative.   Genitourinary: Negative.   Musculoskeletal: Negative.   Skin: Negative.   Neurological: Negative.   Endo/Heme/Allergies: Negative.   Psychiatric/Behavioral: Positive for substance abuse.    Blood pressure 100/68, pulse 72, temperature 98.3 F (36.8 C), temperature source Oral, resp. rate 14, SpO2 97 %.There is  no height or weight on file to calculate BMI.  General Appearance: Fairly Groomed  Eye Contact:  Good  Speech:  Clear and Coherent  Volume:  Normal  Mood:  Euthymic  Affect:  Appropriate  Thought Process:  Coherent and Linear  Orientation:  Full (Time, Place, and Person)  Thought Content:  Logical  Suicidal Thoughts:  No  Homicidal Thoughts:  No  Memory:  Immediate;   Good Recent;   Good Remote;   Good  Judgement:  Fair  Insight:  Fair  Psychomotor Activity:  Normal  Concentration:  Concentration: Good and Attention Span: Good  Recall:  Good  Fund of Knowledge:  Good  Language:  Good  Akathisia:  No  Handed:  Right  AIMS (if indicated):     Assets:  Desire for Improvement  ADL's:  Intact  Cognition:  WNL  Sleep:   fair     Demographic Factors:  Low socioeconomic status and Unemployed  Loss Factors: Financial problems/change in socioeconomic status  Historical Factors: Family history of mental illness or substance abuse  Risk Reduction Factors:   Positive coping skills or problem solving skills  Continued Clinical Symptoms:  Alcohol/Substance Abuse/Dependencies  Cognitive Features That Contribute To Risk:  None    Suicide Risk:  Minimal: No identifiable suicidal ideation.  Patients presenting with no risk factors but with morbid ruminations; may be classified as minimal risk based on the severity of the depressive symptoms    Plan Of Care/Follow-up recommendations:  Activity:  as tolerated Diet:  normal  Disposition:  Peer support evaluation and referral Continue current medications at home. Stable for home discharge-no evidence of harm to self and others.  Thedore Mins, MD 04/27/2018, 10:56 AM

## 2018-04-27 NOTE — ED Notes (Signed)
Pt d/c home per MD order. Discharge summary reviewed with pt. Pt verbalizes understanding. Denies SI/HI. Pt signed e-signature. Personal property returned. Ambulatory off unit.

## 2018-05-05 ENCOUNTER — Encounter (HOSPITAL_COMMUNITY): Payer: Self-pay | Admitting: Emergency Medicine

## 2018-05-05 ENCOUNTER — Other Ambulatory Visit: Payer: Self-pay

## 2018-05-05 ENCOUNTER — Emergency Department (HOSPITAL_COMMUNITY)
Admission: EM | Admit: 2018-05-05 | Discharge: 2018-05-05 | Disposition: A | Discharge status: Home / Self Care | Payer: Self-pay | Attending: Emergency Medicine | Admitting: Emergency Medicine

## 2018-05-05 DIAGNOSIS — Z79899 Other long term (current) drug therapy: Secondary | ICD-10-CM | POA: Insufficient documentation

## 2018-05-05 DIAGNOSIS — Z760 Encounter for issue of repeat prescription: Secondary | ICD-10-CM

## 2018-05-05 DIAGNOSIS — F419 Anxiety disorder, unspecified: Secondary | ICD-10-CM | POA: Insufficient documentation

## 2018-05-05 DIAGNOSIS — I10 Essential (primary) hypertension: Secondary | ICD-10-CM | POA: Insufficient documentation

## 2018-05-05 DIAGNOSIS — F17210 Nicotine dependence, cigarettes, uncomplicated: Secondary | ICD-10-CM | POA: Insufficient documentation

## 2018-05-05 MED ORDER — FLUOXETINE HCL 40 MG PO CAPS: 40.0000 mg | ORAL_CAPSULE | Freq: Every day | ORAL | 0 refills | Status: AC

## 2018-05-05 MED ORDER — GABAPENTIN 800 MG PO TABS: 800.0000 mg | ORAL_TABLET | Freq: Three times a day (TID) | ORAL | 0 refills | Status: AC

## 2018-05-05 MED ORDER — CARBAMAZEPINE 100 MG PO CHEW: 100.0000 mg | CHEWABLE_TABLET | Freq: Three times a day (TID) | ORAL | 0 refills | Status: DC

## 2018-05-05 MED ORDER — CARBAMAZEPINE 100 MG PO CHEW: 100.0000 mg | CHEWABLE_TABLET | Freq: Three times a day (TID) | ORAL | 0 refills | Status: AC

## 2018-05-05 MED ORDER — METOPROLOL SUCCINATE ER 100 MG PO TB24: 100.0000 mg | ORAL_TABLET | Freq: Every day | ORAL | 0 refills | Status: AC

## 2018-05-05 MED FILL — FLUoxetine HCL 40 MG CAPS: 40 | 30 days supply | Qty: 30 | Fill #0

## 2018-05-05 MED FILL — METOPROLOL SUCCINATE ER 100: 100 | 30 days supply | Qty: 30 | Fill #0

## 2018-05-05 MED FILL — GABAPENTIN 800 MG TABLET: 800 | 5 days supply | Qty: 15 | Fill #0

## 2018-05-05 NOTE — ED Provider Notes (Signed)
[facility]   CSN: [id number] Arrival date & time: 05/05/18  1332    History   Chief Complaint Chief Complaint  Patient presents with  . Medication Refill    HPI Kenneth Elliott is a 43 y.o. male.     HPI   43 year old male presents today for medication refill.  Patient has a history of bipolar disorder, depression, hypertension, seizures.  He notes he has run out of his psychiatric medication and has not taken them over the last 2 days.  He notes a successful combination of Tegretol, Prozac, metoprolol and gabapentin have kept his mind clear.  Patient notes increasing anxiety over the last 2 days after not having his medications.  Denies any homicidal suicidal ideations.  Patient notes that he is working but but has been laid off recently secondary to pandemic.  He notes he is unable to for his medications at this time.  He notes that his primary care provider will write his medications but cannot afford to see them presently.  Past Medical History:  Diagnosis Date  . Bipolar 1 disorder (HCC)   . Depression   . Hypertension   . Lupus (HCC)   . Rheumatoid arthritis (HCC)   . Seizures (HCC)    per patient    Patient Active Problem List   Diagnosis Date Noted  . 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 Adjustment disorder with mixed disturbance of emotions and conduct   . Substance induced mood disorder (HCC) 01/17/2016  . Polysubstance dependence (HCC) 10/05/2015    History reviewed. No pertinent surgical history.      Home Medications    Prior to Admission medications   Medication Sig Start Date End Date Taking? Authorizing Provider  carbamazepine (TEGRETOL) 100 MG chewable tablet Chew 1 tablet (100 mg total) by mouth 3 (three) times daily. For mood stabilization 05/05/18   Crispin Vogel, Tinnie GensJeffrey, PA-C  FLUoxetine (PROZAC) 40 MG capsule Take 1 capsule (40 mg total) by mouth daily. For depression 05/05/18    Freman Lapage, Tinnie GensJeffrey, PA-C  gabapentin (NEURONTIN) 400 MG capsule Take 2 capsules (800 mg total) by mouth 3 (three) times daily. For agitation 02/12/18   Armandina StammerNwoko, Agnes I, NP  gabapentin (NEURONTIN) 800 MG tablet Take 1 tablet (800 mg total) by mouth 3 (three) times daily. 05/05/18   Ines Warf, Tinnie GensJeffrey, PA-C  metoprolol succinate (TOPROL-XL) 100 MG 24 hr tablet Take 1 tablet (100 mg total) by mouth daily. Take with or immediately following a meal: For high blood pressure 05/05/18   Arnetia Bronk, Tinnie GensJeffrey, PA-C  QUEtiapine (SEROQUEL) 200 MG tablet Take 1 tablet (200 mg total) by mouth at bedtime. For mood control Patient taking differently: Take 200 mg by mouth at bedtime as needed (mood control). For mood control 02/12/18   Armandina StammerNwoko, Agnes I, NP  traZODone (DESYREL) 50 MG tablet Take 1 tablet (50 mg total) by mouth at bedtime as needed for sleep. 02/12/18   Sanjuana KavaNwoko, Agnes I, NP    Family History History reviewed. No pertinent family history.  Social History Social History   Tobacco Use  . Smoking status: Current Every Day Smoker    Packs/day: 1.50    Types: Cigarettes  . Smokeless tobacco: Never Used  Substance Use Topics  . Alcohol use: Yes    Alcohol/week: 6.0 - 8.0 standard drinks    Types: 6 - 8 Cans of beer per week    Comment: half gal liquor on the weekends  . Drug use: Yes  Comment: opiates/benzos     Allergies   Patient has no known allergies.   Review of Systems Review of Systems  All other systems reviewed and are negative.    Physical Exam Updated Vital Signs BP (!) 162/104 (BP Location: Right Arm)   Pulse (!) 116   Temp 98.2 F (36.8 C) (Oral)   SpO2 99%   Physical Exam Vitals signs and nursing note reviewed.  Constitutional:      Appearance: He is well-developed.  HENT:     Head: Normocephalic and atraumatic.  Eyes:     General: No scleral icterus.       Right eye: No discharge.        Left eye: No discharge.     Conjunctiva/sclera: Conjunctivae normal.     Pupils:  Pupils are equal, round, and reactive to light.  Neck:     Musculoskeletal: Normal range of motion.     Vascular: No JVD.     Trachea: No tracheal deviation.  Pulmonary:     Effort: Pulmonary effort is normal.     Breath sounds: No stridor.  Neurological:     Mental Status: He is alert and oriented to person, place, and time.     Coordination: Coordination normal.  Psychiatric:        Behavior: Behavior normal.        Thought Content: Thought content normal.        Judgment: Judgment normal.      ED Treatments / Results  Labs (all labs ordered are listed, but only abnormal results are displayed) Labs Reviewed - No data to display  EKG None  Radiology No results found.  Procedures Procedures (including critical care time)  Medications Ordered in ED Medications - No data to display   Initial Impression / Assessment and Plan / ED Course  I have reviewed the triage vital signs and the nursing notes.  Pertinent labs & imaging results that were available during my care of the patient were reviewed by me and considered in my medical decision making (see chart for details).        43 year old male presents today for medication refill.  Patient appears to be in no acute distress but is slightly anxious.  He has no suicidal homicidal ideations no hallucinations.  Generally medication refill in the emergency room is not appropriate but given the current situation with the pandemic I do find it is difficult for patients to be seen by primary care providers.  Patient does not have any incoming income that would help him afford his medications.  I do feel providing medications here in the emergency room would benefit the patient and avoid any relapsing.  Care management was consulted they will help arrange medication refill here at this time, patient will be discharged with medications, he is informed that he may not have medication refills again in the emergency room and that he  would need to follow closely as an outpatient with a very [facility] primary care provider.  He is given return precautions.  He verbalized understanding and agreement to today's plan had no further questions or concerns.  Final Clinical Impressions(s) / ED Diagnoses   Final diagnoses:  Anxiety  Medication refill    ED Discharge Orders         Ordered    carbamazepine (TEGRETOL) 100 MG chewable tablet  3 times daily     05/05/18 1414    FLUoxetine (PROZAC) 40 MG capsule  Daily  05/05/18 1414    metoprolol succinate (TOPROL-XL) 100 MG 24 hr tablet  Daily     05/05/18 1414    gabapentin (NEURONTIN) 800 MG tablet  3 times daily     05/05/18 1414           Eyvonne Mechanic, PA-C 05/05/18 1419    Melene Plan, DO 05/05/18 [phone]

## 2018-05-05 NOTE — TOC Transition Note (Signed)
Transition of Care [facility]) - CM/SW Discharge Note   Patient Details  Name: DOYLE SHERFEY MRN: [id number] Date of Birth: 03-03-1975  [facility] of [facility] CM/SW Contact:  Oletta Cohn, RN Phone Number: 05/05/2018, 2:36 PM   Clinical Narrative:    ED CM consulted by EDP Hedges for medication assistance. EDCM reviewed chart and spoke with the pt about [facility] MATCH program ($3 co pay for each Rx through [facility] program, does not include refills, 7 day expiration of MATCH letter and choice of pharmacies). Pt is eligible for [facility] MATCH program (unable to find pt listed in PROCARE per cardholder name inquiry) and can not afford co-pay.  EDCM will place over-ride on co-payment. PROCARE information entered. MATCH letter completed and provided to pt.  EDCM updated EDP and ED RN.   EDCM also confirmed that pt does not have PCP. NCM discussed and provided written information for uninsured PCP and the importance of PCP for f/u care.    Final next level of care: Home/Self Care Barriers to Discharge: Barriers Resolved   Patient Goals and CMS Choice    To be able to afford medications.    Discharge Placement                       Discharge Plan and Services In-house Referral: PCP / [facility] Discharge Planning Services: CM Consult, MATCH Program            DME Arranged: N/A DME Agency: NA       Social Determinants of Health (SDOH) Interventions     Readmission Risk Interventions No flowsheet data found.

## 2018-05-05 NOTE — ED Triage Notes (Signed)
Pt reports he is out of his Prozac, Tegretol, Gabapentin, metoprolol x 2 days. Reports he has no insurance and can't see PCP or afford his medications.

## 2018-05-05 NOTE — Discharge Instructions (Addendum)
Please read attached information. If you experience any new or worsening signs or symptoms please return to the emergency room for evaluation. Please follow-up with your primary care provider or specialist as discussed. Please use medication prescribed only as directed and discontinue taking if you have any concerning signs or symptoms.   °

## 2018-05-05 NOTE — ED Notes (Addendum)
Discharge instructions discussed with Pt. Pt verbalized understanding. Pt stable and ambulatory.  Pt waiting on discharge medications to be delivered from pharmacy.

## 2020-09-06 IMAGING — DX DG CHEST 1V PORT
1 series · 1 of 1 positions shown · non-contrast
Comparison: None.

CLINICAL DATA: Initial evaluation for acute tachycardia.

EXAM:
PORTABLE CHEST 1 VIEW

[chest ap]
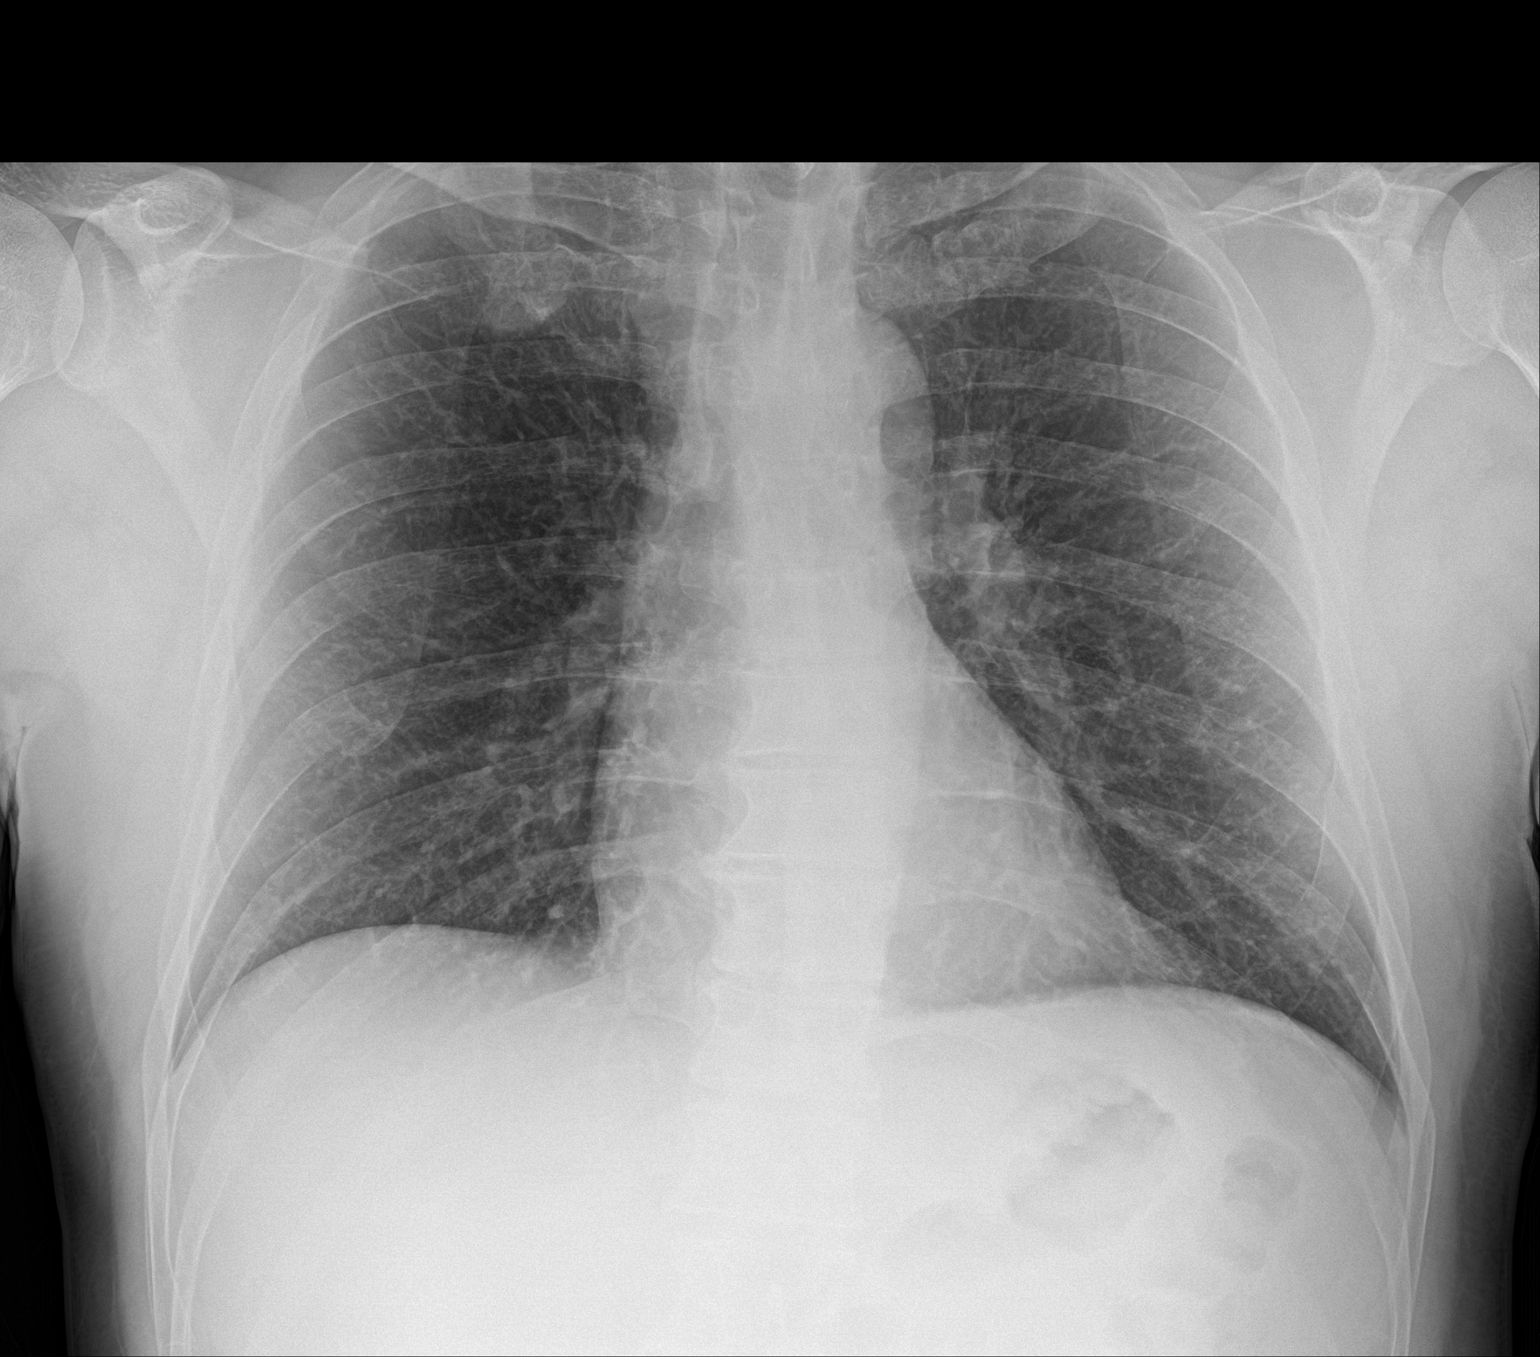

[1 of 1 positions shown; findings below may reference images not displayed]

FINDINGS: The cardiac and mediastinal silhouettes are stable in size and
contour, and remain within normal limits.

The lungs are normally inflated. No airspace consolidation, pleural
effusion, or pulmonary edema is identified. There is no
pneumothorax.

No acute osseous abnormality identified.
IMPRESSION: No active cardiopulmonary disease.

## 2020-10-18 IMAGING — CT CT ABD-PELV W/ CM
2 of 4 series · 16 of 46 positions shown, 18 images · IV contrast (APPLIED)
Comparison: None.

CLINICAL DATA: Left lower quadrant pain starting yesterday
associated with diarrhea.

EXAM:
CT ABDOMEN AND PELVIS WITH CONTRAST
TECHNIQUE: Multidetector CT imaging of the abdomen and pelvis was performed
using the standard protocol following bolus administration of
intravenous contrast.
CONTRAST:  100mL OMNIPAQUE IOHEXOL 300 MG/ML  SOLN

[Series 3: abdomen 5.0 · axial · 0.91mm/px · z∈[+782,+1232]mm · 13 of 102 slices shown, 15 images]
[im 6/102  soft-tissue]
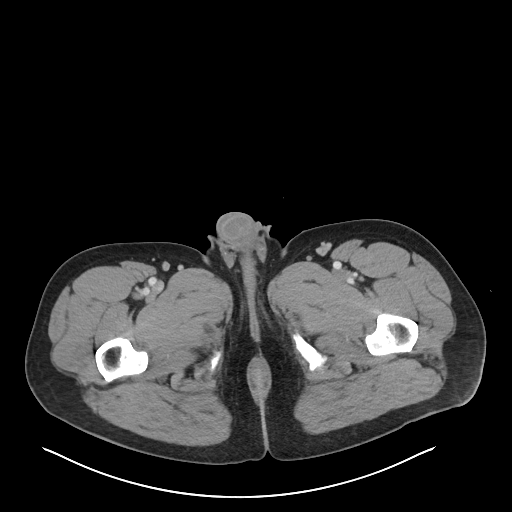
[im 6/102  bone]
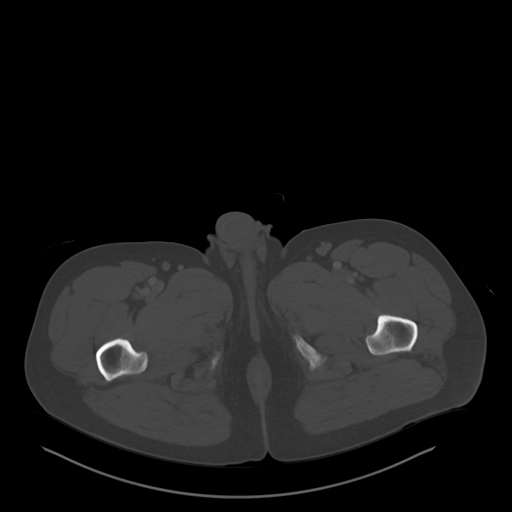
[im 12/102  soft-tissue]
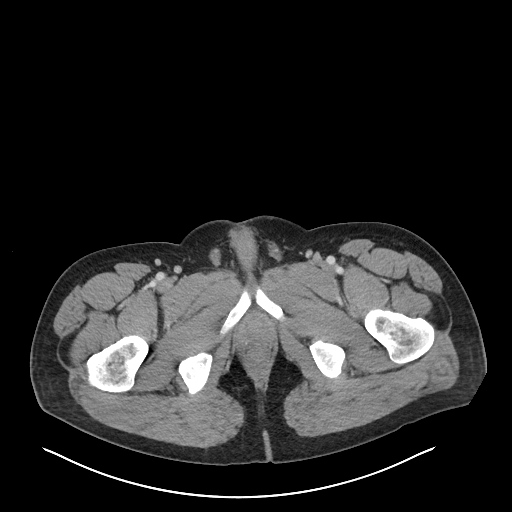
[im 24/102  soft-tissue]
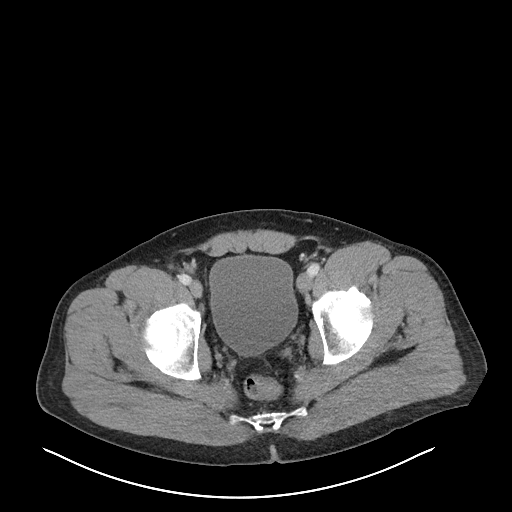
[im 30/102  soft-tissue]
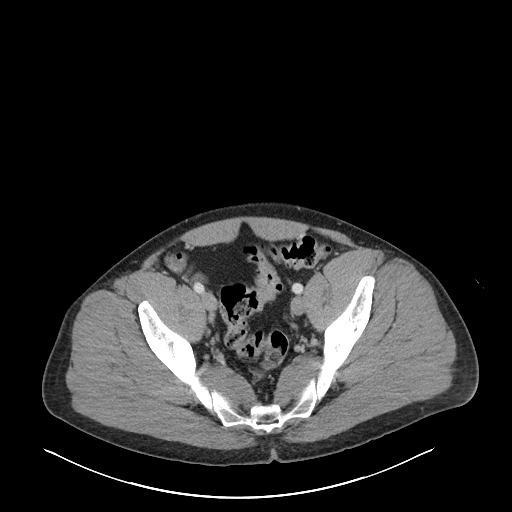
[im 36/102  soft-tissue]
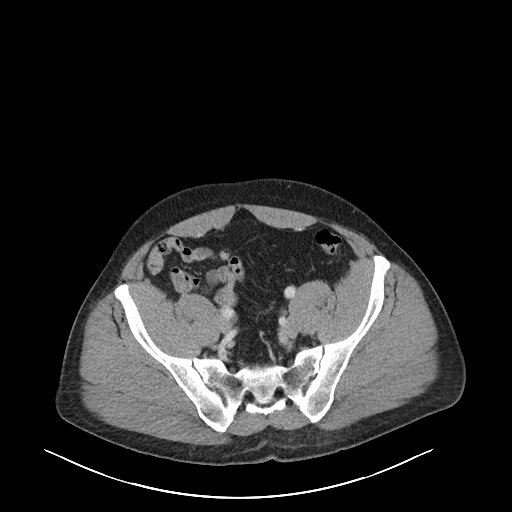
[im 42/102  soft-tissue]
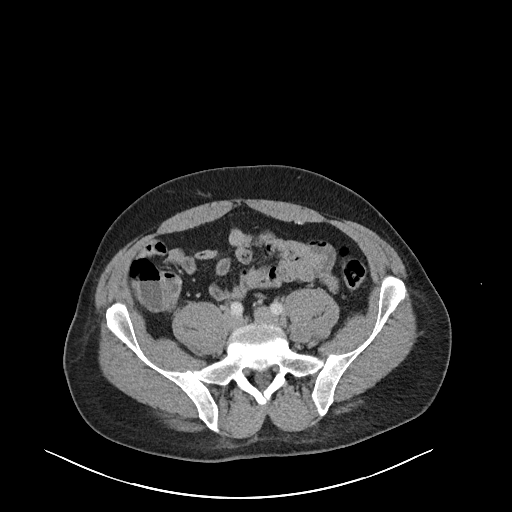
[im 54/102  soft-tissue]
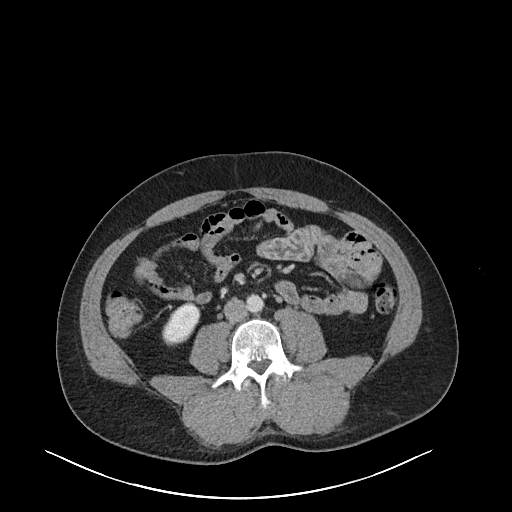
[im 60/102  soft-tissue]
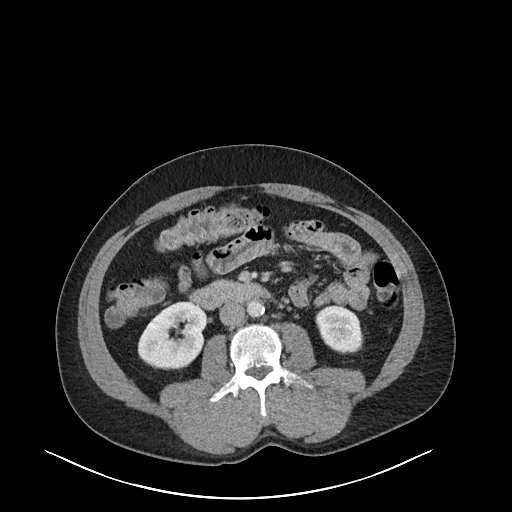
[im 66/102  soft-tissue]
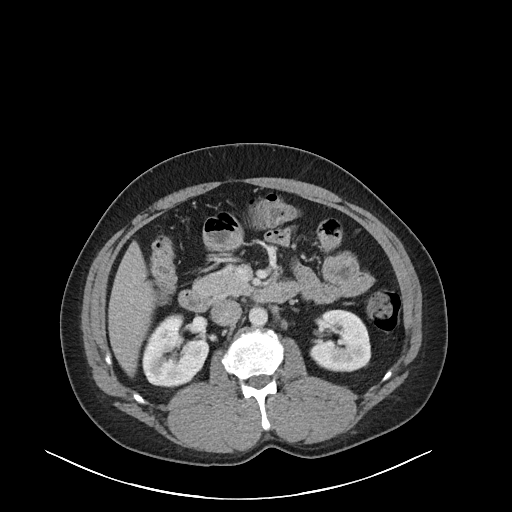
[im 66/102  bone]
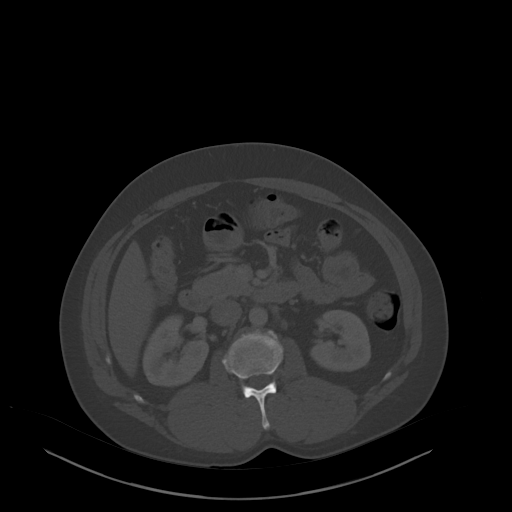
[im 72/102  soft-tissue]
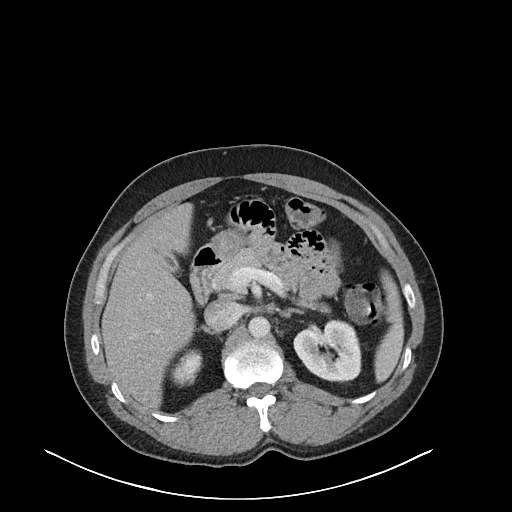
[im 78/102  soft-tissue]
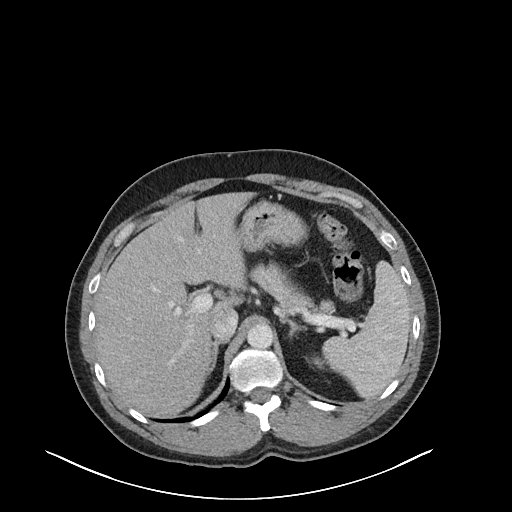
[im 90/102  soft-tissue]
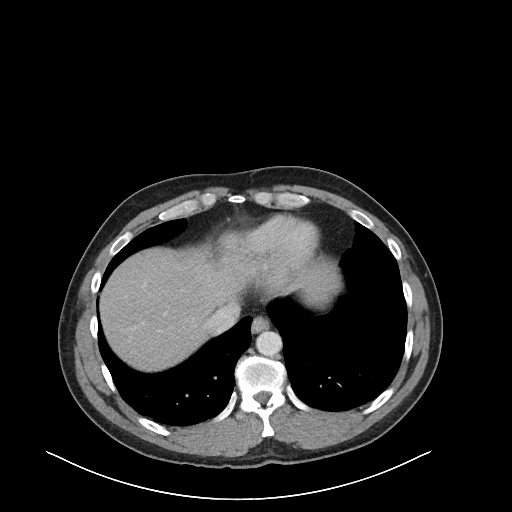
[im 96/102  soft-tissue]
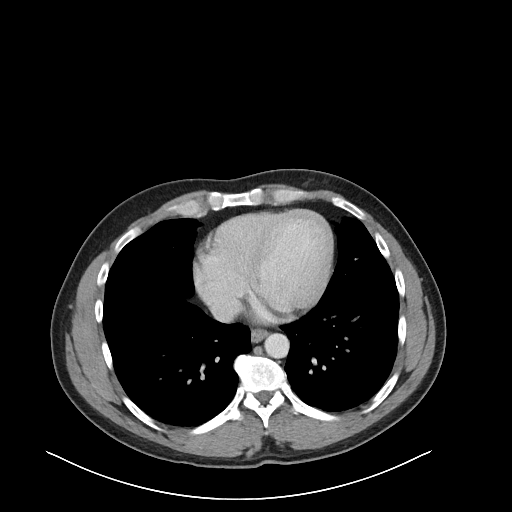

[Series 6: abdomen 3.0 mpr cor · coronal · 0.91mm/px · 3 of 112 slices shown]
[im 38/112  soft-tissue]
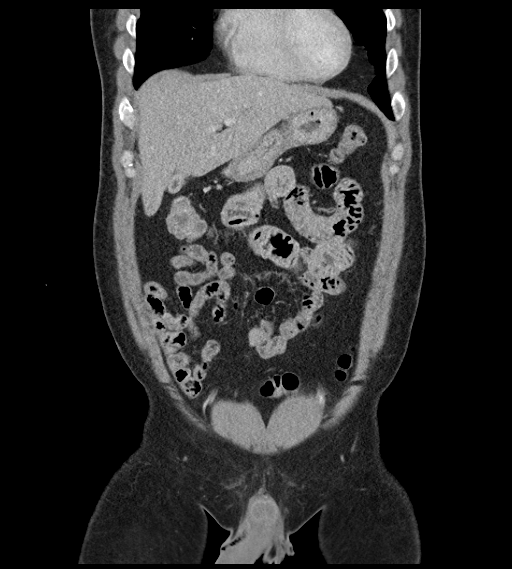
[im 50/112  soft-tissue]
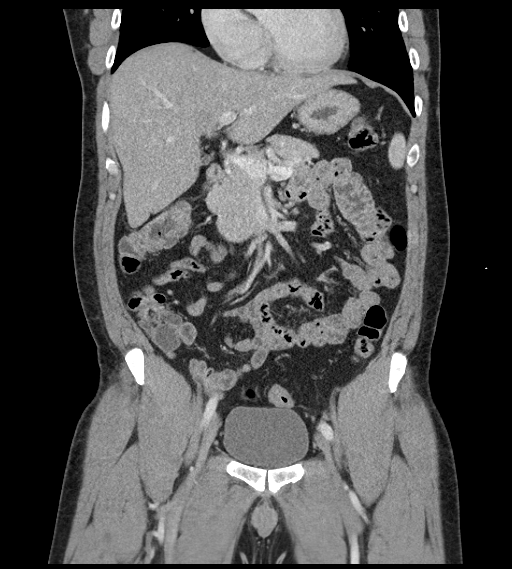
[im 62/112  soft-tissue]
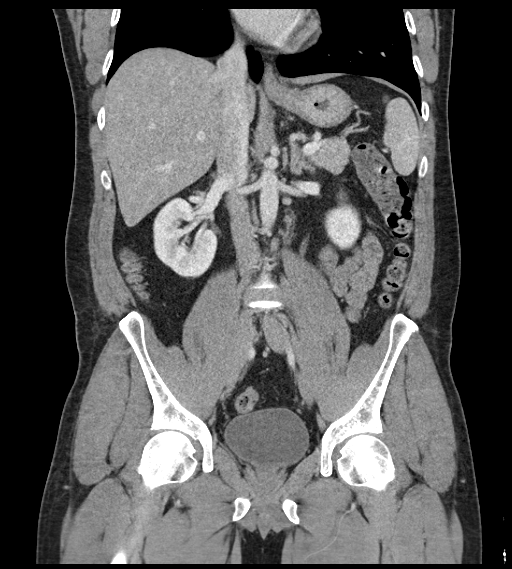

[16 of 46 positions shown; findings below may reference images not displayed]

FINDINGS: Lower chest: No acute abnormality.

Hepatobiliary: No focal liver abnormality is seen. No gallstones,
gallbladder wall thickening, or biliary dilatation.

Pancreas: Unremarkable. No pancreatic ductal dilatation or
surrounding inflammatory changes.

Spleen: Normal in size without focal abnormality.

Adrenals/Urinary Tract: No adrenal masses.

Kidneys normal size, orientation and position with symmetric
enhancement. 4 mm low-density lesion, medial midpole right kidney,
consistent with a cyst. Tiny nonobstructing stone in the lower pole
the right kidney. No other renal masses or stones. No
hydronephrosis.

Ureters normal course and in caliber.  Bladder is unremarkable.

Stomach/Bowel: Normal stomach. Small bowel and colon are normal in
caliber. No wall thickening. No inflammation. Specifically, no
diverticulitis to account for left lower quadrant pain.

Normal appendix visualized.

Vascular/Lymphatic: Minimal aortic atherosclerosis. No other
vascular abnormality. No enlarged lymph nodes.

Reproductive: Unremarkable.

Other: No abdominal wall hernia or abnormality. No abdominopelvic
ascites.

Musculoskeletal: No acute or significant osseous findings.
IMPRESSION: 1. No acute findings within the abdomen or pelvis. No findings to
account for left lower quadrant pain.
2. Tiny low-density right renal lesion consistent with a cyst. Tiny
nonobstructing stone in the lower pole the right kidney.
3. Minor aortic atherosclerosis.  No other abnormalities.
